# Patient Record
Sex: Female | Born: 1960 | Race: White | Hispanic: No | Marital: Single | Smoking: Never smoker
Health system: Southern US, Community
[De-identification: ages and names within clinical notes are randomized; demographics above are authoritative.]

## PROBLEM LIST (undated history)

## (undated) DIAGNOSIS — I10 Essential (primary) hypertension: Secondary | ICD-10-CM

## (undated) DIAGNOSIS — K50819 Crohn's disease of both small and large intestine with unspecified complications: Secondary | ICD-10-CM

## (undated) DIAGNOSIS — Z9889 Other specified postprocedural states: Secondary | ICD-10-CM

## (undated) DIAGNOSIS — J301 Allergic rhinitis due to pollen: Secondary | ICD-10-CM

## (undated) DIAGNOSIS — D219 Benign neoplasm of connective and other soft tissue, unspecified: Secondary | ICD-10-CM

## (undated) DIAGNOSIS — R112 Nausea with vomiting, unspecified: Secondary | ICD-10-CM

## (undated) DIAGNOSIS — T63301A Toxic effect of unspecified spider venom, accidental (unintentional), initial encounter: Secondary | ICD-10-CM

## (undated) DIAGNOSIS — K649 Unspecified hemorrhoids: Secondary | ICD-10-CM

## (undated) DIAGNOSIS — B1920 Unspecified viral hepatitis C without hepatic coma: Secondary | ICD-10-CM

## (undated) HISTORY — PX: TUBAL LIGATION: SHX77

## (undated) HISTORY — DX: Crohn's disease of both small and large intestine with unspecified complications: K50.819

---

## 2007-05-20 ENCOUNTER — Ambulatory Visit: Payer: Self-pay | Admitting: Gastroenterology

## 2007-07-05 ENCOUNTER — Other Ambulatory Visit: Admission: RE | Admit: 2007-07-05 | Discharge: 2007-07-05 | Payer: Self-pay | Admitting: Family Medicine

## 2007-07-20 ENCOUNTER — Encounter: Admission: RE | Admit: 2007-07-20 | Discharge: 2007-07-20 | Payer: Self-pay | Admitting: Family Medicine

## 2007-11-09 ENCOUNTER — Encounter: Admission: RE | Admit: 2007-11-09 | Discharge: 2007-11-09 | Payer: Self-pay | Admitting: Obstetrics and Gynecology

## 2008-08-04 ENCOUNTER — Encounter: Admission: RE | Admit: 2008-08-04 | Discharge: 2008-08-04 | Payer: Self-pay | Admitting: Family Medicine

## 2009-01-24 ENCOUNTER — Other Ambulatory Visit: Admission: RE | Admit: 2009-01-24 | Discharge: 2009-01-24 | Payer: Self-pay | Admitting: Family Medicine

## 2013-05-15 ENCOUNTER — Inpatient Hospital Stay (HOSPITAL_COMMUNITY)
Admission: EM | Admit: 2013-05-15 | Discharge: 2013-05-16 | DRG: 174 | Disposition: A | Discharge status: Home / Self Care | Payer: BC Managed Care – PPO | Attending: Internal Medicine | Admitting: Internal Medicine

## 2013-05-15 ENCOUNTER — Encounter (HOSPITAL_COMMUNITY): Payer: Self-pay | Admitting: Nurse Practitioner

## 2013-05-15 DIAGNOSIS — K922 Gastrointestinal hemorrhage, unspecified: Principal | ICD-10-CM | POA: Diagnosis present

## 2013-05-15 DIAGNOSIS — D649 Anemia, unspecified: Secondary | ICD-10-CM

## 2013-05-15 DIAGNOSIS — I951 Orthostatic hypotension: Secondary | ICD-10-CM | POA: Diagnosis present

## 2013-05-15 DIAGNOSIS — D62 Acute posthemorrhagic anemia: Secondary | ICD-10-CM | POA: Diagnosis present

## 2013-05-15 DIAGNOSIS — D892 Hypergammaglobulinemia, unspecified: Secondary | ICD-10-CM | POA: Diagnosis present

## 2013-05-15 DIAGNOSIS — B1920 Unspecified viral hepatitis C without hepatic coma: Secondary | ICD-10-CM | POA: Diagnosis present

## 2013-05-15 DIAGNOSIS — K649 Unspecified hemorrhoids: Secondary | ICD-10-CM

## 2013-05-15 DIAGNOSIS — D509 Iron deficiency anemia, unspecified: Secondary | ICD-10-CM | POA: Diagnosis present

## 2013-05-15 HISTORY — DX: Unspecified viral hepatitis C without hepatic coma: B19.20

## 2013-05-15 HISTORY — DX: Allergic rhinitis due to pollen: J30.1

## 2013-05-15 HISTORY — DX: Benign neoplasm of connective and other soft tissue, unspecified: D21.9

## 2013-05-15 HISTORY — DX: Essential (primary) hypertension: I10

## 2013-05-15 LAB — CBC
HCT: 20.7 % — ABNORMAL LOW (ref 36.0–46.0)
Hemoglobin: 6 g/dL — CL (ref 12.0–15.0)
MCHC: 29 g/dL — ABNORMAL LOW (ref 30.0–36.0)
MCV: 71.1 fL — ABNORMAL LOW (ref 78.0–100.0)
RDW: 17.9 % — ABNORMAL HIGH (ref 11.5–15.5)

## 2013-05-15 LAB — COMPREHENSIVE METABOLIC PANEL
AST: 53 U/L — ABNORMAL HIGH (ref 0–37)
BUN: 9 mg/dL (ref 6–23)
CO2: 20 mEq/L (ref 19–32)
GFR calc non Af Amer: >90 mL/min (ref >90)
Glucose, Bld: 109 mg/dL — ABNORMAL HIGH (ref 70–99)
Potassium: 3.8 mEq/L (ref 3.5–5.1)

## 2013-05-15 LAB — PREPARE RBC (CROSSMATCH)

## 2013-05-15 LAB — CBC WITH DIFFERENTIAL/PLATELET
Basophils Relative: 1 % (ref 0–1)
Eosinophils Relative: 4 % (ref 0–5)
Lymphocytes Relative: 46 % (ref 12–46)
Lymphs Abs: 3 10*3/uL (ref 0.7–4.0)
MCHC: 28.6 g/dL — ABNORMAL LOW (ref 30.0–36.0)
Monocytes Absolute: 0.7 10*3/uL (ref 0.1–1.0)
Neutro Abs: 2.4 10*3/uL (ref 1.7–7.7)
Neutrophils Relative %: 38 % — ABNORMAL LOW (ref 43–77)
Platelets: 604 10*3/uL — ABNORMAL HIGH (ref 150–400)
RDW: 18 % — ABNORMAL HIGH (ref 11.5–15.5)
WBC: 6.4 10*3/uL (ref 4.0–10.5)

## 2013-05-15 LAB — PROTIME-INR
INR: 0.98 (ref 0.00–1.49)
Prothrombin Time: 12.8 seconds (ref 11.6–15.2)

## 2013-05-15 MED ORDER — SODIUM CHLORIDE 0.9 % IV BOLUS (SEPSIS)
1000.0000 mL | Freq: Once | INTRAVENOUS | Status: AC
  Administered 2013-05-15: 1000 mL via INTRAVENOUS

## 2013-05-15 MED ORDER — SODIUM CHLORIDE 0.9 % IV SOLN
INTRAVENOUS | Status: DC
  Administered 2013-05-16: 06:00:00 via INTRAVENOUS

## 2013-05-15 MED ORDER — SODIUM CHLORIDE 0.9 % IJ SOLN: 3.0000 mL | Freq: Two times a day (BID) | INTRAMUSCULAR | Status: DC

## 2013-05-15 MED ORDER — PANTOPRAZOLE SODIUM 40 MG IV SOLR
40.0000 mg | Freq: Two times a day (BID) | INTRAVENOUS | Status: DC
  Administered 2013-05-16: 40 mg via INTRAVENOUS
  Filled 2013-05-15 (×3): qty 40

## 2013-05-15 NOTE — ED Notes (Signed)
Per admitting MD Winona Legato, pt order to be transfused after result of next timed CBC lab results are completed. Pt is asymptomatic, denies any complaints at this time and will continue to monitor pt.

## 2013-05-15 NOTE — ED Notes (Signed)
Pt states that she is just feeling very tired. Pt denies chest pain. Pt states about one week ago she started having bloody bowel movements. Pt states 2 years ago she had colonoscopy and was told she had internal hemorrhoids. Pt states she has not had bloody bowel movements every day. Pt states some foods trigger it and sometimes she has diarrhea. Pt states she used to be very active and now she is unable to exercise as much. Pt states hx of hep C and needs liver biopsy. Pt alert and mentating appropriately.

## 2013-05-15 NOTE — H&P (Signed)
Date: 05/15/2013               Patient Name:  Abigail Beck MRN: [id number]  DOB: [birth year] Age / Sex: 52 y.o., female   PCP: Romie Jumper, PA-C         Medical Service: Internal Medicine Teaching Service         Attending Physician: Dr. Audree Camel, MD    First Contact: Dr. Archer Asa, MD Pager: [phone]  Second Contact: Dr. Suszanne Conners, MD Pager: [phone]       After Hours (After 5p/  First Contact Pager: [phone]  weekends / holidays): Second Contact Pager: [phone]   Chief Complaint: Blood in stool  History of Present Illness: Abigail Beck is a 52 y.o. woman whith a pmhx sign for hep C (unknown status) who presents with a cc of blood in her stool.Two days before the admission, she went to GI MD because having chronic diarrhea that was now complicated by blood. The patient reports 7-8 months of diarrhea at 5 times per day. She has a crampy feeling that is releaved by BM. She has urgency to get tot he rest room but no incontinence.  The diarrhea initially improved when she cut out dairy and gluten from her diet. During this time she has noticed blood in her stool intermittently. She thought it was likely due to her hemorrhoid as there was just a small amount of blood on the tissue. However, over the last few weeks she noticed clots in her stool as well as bright red blood of small volume. The last time she saw blood in stool was today prior to arrival at ED. The episode before that was on Friday. She associates it with spicey/processed foods. She admits to feeling weak and tired for about 6 months. Apparently, blood work done by Daisy Floro in ?June was significant for anemia, but was thought to not be significant enough tow arrant further investigation at that time. She has been on iron for anemia before 2/2 fibroids, but has no h/o blood transfusions  Of note, Dr. Loman Chroman  thought that the BBM could be due to colitis and prescribed a medication that that patient  cannot recal. GI got blood work on the morning of the admission as pre-op for potential liver bx, which revealed a Hg 6.5. The patient was contacted and told to go to the ED.  Denies, SOB, N/V, CP, abdominal pain, constipation, fevers, chills, dysuria.  Meds: No current facility-administered medications for this encounter.   Current Outpatient Prescriptions  Medication Sig Dispense Refill  . Multiple Vitamins-Minerals (MULTIVITAMIN WITH MINERALS) tablet Take 1 tablet by mouth daily.        Allergies: Allergies as of 05/15/2013  . (No Known Allergies)   Past Medical History  Diagnosis Date  . Hepatitis C ~2011/2012    Incidental with physical; unclear etiology  . Fibroids   . Hypertension     Situational, diet & exercise controlled  . Hay fever    Past Surgical History  Procedure Laterality Date  . Tubal ligation     Family History  Problem Relation Age of Onset  . Alzheimer's disease Mother   . Diabetes Mother   . Glucose-6-phos deficiency Son   . Healthy Father     63 yo without medical problems  . Healthy Paternal Grandmother     32 yo without medical problems  . Liver disease Sister     ?Fatty liver disease   History  Social History  . Marital Status: Single    Spouse Name: N/A    Number of Children: N/A  . Years of Education: college   Occupational History  . pharmceutical representative    Social History Main Topics  . Smoking status: Never Smoker   . Smokeless tobacco: Not on file  . Alcohol Use: No  . Drug Use: No  . Sexual Activity: No     Comment: divorced around 2006, not sexually active since   Other Topics Concern  . Not on file   Social History Narrative   Lives alone, son is in [place] (Psychologist, counselling at [employer]    Review of Systems: Pertinent items are noted in HPI.  Physical Exam: Blood pressure 137/80, pulse 95, temperature 98.9 F (37.2 C), temperature source Oral, resp. rate 15, height 5\' 5"  (1.651 m), weight 150 lb  (68.04 kg), SpO2 100.00%. Physical Exam  Constitutional: She is oriented to person, place, and time. She appears well-developed and well-nourished. No distress.  HENT:  Mouth/Throat: Oropharynx is clear and moist. No oropharyngeal exudate.  Eyes: EOM are normal. Pupils are equal, round, and reactive to light.  Pale conjunctiva  Cardiovascular: Regular rhythm and intact distal pulses.  Exam reveals no gallop and no friction rub.   Murmur heard. Systolic flow murmur, tachycardic. (increased 100 to 120 when sat up)  Pulmonary/Chest: Effort normal and breath sounds normal. No respiratory distress. She has no wheezes. She has no rales. She exhibits no tenderness.  Abdominal: Soft. Bowel sounds are normal. She exhibits no distension. There is no tenderness. There is no rebound and no guarding.  Genitourinary:  hemorrhoid present, not active bleeding.  Musculoskeletal: She exhibits no edema and no tenderness.  Neurological: She is alert and oriented to person, place, and time.  Skin: She is not diaphoretic. No erythema. There is pallor.  Psychiatric: She has a normal mood and affect. Her behavior is normal.  Orthostatics: Lying 142/85, 100 HR Standing: 121/75 HR 108  Lab results: Basic Metabolic Panel:  Recent Labs  09/81/19 1735  NA 133*  K 3.8  CL 102  CO2 20  GLUCOSE 109*  BUN 9  CREATININE 0.73  CALCIUM 8.7  Gap: 11  Liver Function Tests:  Recent Labs  05/15/13 1735  AST 53*  ALT 24  ALKPHOS PENDING  BILITOT 0.2*  PROT 8.9*  ALBUMIN 2.7*   CBC:  Recent Labs  05/15/13 1735  WBC 6.4  NEUTROABS 2.4  HGB 6.9*  HCT 24.1*  MCV 71.1*  PLT 604*   Coagulation:  Recent Labs  05/15/13 1854  LABPROT 12.8  INR 0.98    Assessment & Plan by Problem: Principal Problem:   Acute blood loss anemia Active Problems:   Microcytic anemia  # GI Bleed c/b Microcytic Anemia, tachycardia and orthostasis GI bleed is causing hemodynamic changes necessitating admission. The  bleed could be upper or lower, but more likely lower given BRBPR and BUN/Cr ratio of <35. FOBT +. Etiologies are hemorrhoid, bleeding diverticula, malignancy, and inflammatory bowel disease. - Repeat CBC at 9 pm and transfuse with Hg goal of 7.0. - Consult GI in AM (Dr. Loman Chroman). - 40 IV BID PPI - F/U EKG and anemia panel - Need records from Dr. 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 Cirrhosis and hepatitis can also cause a protein gap. - F/U Repeat CMP in AM for confirmation - F/U as outpatient, unless  anemia is refractory to treatment of GI condition  # Hepatitis C - Appears stable, encourage follow up as outpatient. - Need records from Dr. Loman Chroman  # DVT: SCDs  # Nutrition: clears until midnight then NPO  Dispo: Disposition is deferred at this time, awaiting improvement of current medical problems. Anticipated discharge in approximately 1-2 day(s).   The patient does have a current PCP (Lucy Bernadette Hoit, PA-C) and does not need an [facility] hospital follow-up appointment after discharge.  The patient does not have transportation limitations that hinder transportation to clinic appointments.  Signed: Pleas Koch, MD 05/15/2013, 8:22 PM

## 2013-05-15 NOTE — ED Notes (Signed)
Cri ?

## 2013-05-15 NOTE — ED Notes (Signed)
Pt ambulatory to restroom with steady gait. Pt denies any dizziness or complaints upon return. Will continue to monitor pt.

## 2013-05-15 NOTE — ED Notes (Signed)
Admitting MD at bedside, pt awaiting inpt beds assignment.  

## 2013-05-15 NOTE — ED Provider Notes (Signed)
CSN: [id number]     Arrival date & time 05/15/13  1708 History   First MD Initiated Contact with Patient 05/15/13 1755     Chief Complaint  Patient presents with  . Anemia   (Consider location/radiation/quality/duration/timing/severity/associated sxs/prior Treatment) HPI Comments: [age]-year-old female presents to the emergency department for anemia. She states she had blood work done today in preparation for a liver biopsy for hepatitis C. They called her when they found that her hemoglobin was 6.5. She states that she has been fatigued for several weeks to months has had shortness of breath with less exertion than normal. She denies any chest pain. She has a vomiting or nausea. Denies abdominal pain. She has had intermittent blood in her stools the past week. Does not occur every time. Should have pain when she is having bowel movements. She did say she had a colonoscopy one year ago by Dr. Loman Chroman and states that it showed ulcerative colitis. He states that per her gastroenterologist she had an ultrasound that showed no damage to her liver.  The history is provided by the patient.    Past Medical History  Diagnosis Date  . Hepatitis C   . Fibroids    Past Surgical History  Procedure Laterality Date  . Tubal ligation     History reviewed. No pertinent family history. History  Substance Use Topics  . Smoking status: Never Smoker   . Smokeless tobacco: Not on file  . Alcohol Use: No   OB History   Grav Para Term Preterm Abortions TAB SAB Ect Mult Living                 Review of Systems  Constitutional: Positive for fatigue. Negative for fever and chills.  Respiratory: Positive for shortness of breath (with exertion). Negative for cough.   Cardiovascular: Negative for chest pain.  Gastrointestinal: Positive for blood in stool. Negative for nausea, vomiting, abdominal pain, diarrhea, constipation and rectal pain.  All other systems reviewed and are  negative.    Allergies  Review of patient's allergies indicates no known allergies.  Home Medications   Current Outpatient Rx  Name  Route  Sig  Dispense  Refill  . Multiple Vitamins-Minerals (MULTIVITAMIN WITH MINERALS) tablet   Oral   Take 1 tablet by mouth daily.          BP 137/77  Pulse 101  Temp(Src) 98.9 F (37.2 C) (Oral)  Resp 15  Ht 5\' 5"  (1.651 m)  Wt 150 lb (68.04 kg)  BMI 24.96 kg/m2  SpO2 97% Physical Exam  Vitals reviewed. Constitutional: She is oriented to person, place, and time. She appears well-developed and well-nourished. No distress.  HENT:  Head: Normocephalic and atraumatic.  Right Ear: External ear normal.  Left Ear: External ear normal.  Nose: Nose normal.  Eyes: Right eye exhibits no discharge. Left eye exhibits no discharge.  Cardiovascular: Regular rhythm and normal heart sounds.   Mild tachycardia  Pulmonary/Chest: Effort normal and breath sounds normal.  Abdominal: Soft. There is no tenderness.  Genitourinary: Rectal exam shows external hemorrhoid (Nonthrombosed). Rectal exam shows no mass and no tenderness.  Neurological: She is alert and oriented to person, place, and time.  Skin: Skin is warm and dry.    ED Course  Procedures (including critical care time) Labs Review Labs Reviewed  CBC WITH DIFFERENTIAL - Abnormal; Notable for the following:    RBC 3.39 (*)    Hemoglobin 6.9 (*)    HCT 24.1 (*)  MCV 71.1 (*)    MCH 20.4 (*)    MCHC 28.6 (*)    RDW 18.0 (*)    Platelets 604 (*)    Neutrophils Relative % 38 (*)    All other components within normal limits  COMPREHENSIVE METABOLIC PANEL  PROTIME-INR  APTT  TYPE AND SCREEN  PREPARE RBC (CROSSMATCH)   Imaging Review No results found.  MDM   1. Anemia    Mild tachycardia here but appears well. Per patient she has hx of UC, no gross blood on rectal. Given her hx of bleeding per rectum with her HR and Hgb she will need transfusion and admission. No upper GI sx, and  per patient she has normal liver function. Will admit to medicine.    Audree Camel, MD 05/16/13 [id number]

## 2013-05-15 NOTE — ED Notes (Signed)
Pt had blood work done at PCP office today for pre op and her Hgb was low. Pt is supposed to have a liver biopsy because she has hepatitis C. Pt states it was 6.5. Reports a history of anemia. She has felt dizzy and SOB intermittently lately but these symptoms have been mild. A&Ox4, resp e/u

## 2013-05-16 DIAGNOSIS — D649 Anemia, unspecified: Secondary | ICD-10-CM

## 2013-05-16 LAB — CBC
HCT: 23.6 % — ABNORMAL LOW (ref 36.0–46.0)
MCHC: 30.1 g/dL (ref 30.0–36.0)
MCV: 72.8 fL — ABNORMAL LOW (ref 78.0–100.0)
RDW: 18.1 % — ABNORMAL HIGH (ref 11.5–15.5)

## 2013-05-16 LAB — COMPREHENSIVE METABOLIC PANEL
Albumin: 2.3 g/dL — ABNORMAL LOW (ref 3.5–5.2)
BUN: 6 mg/dL (ref 6–23)
Creatinine, Ser: 0.73 mg/dL (ref 0.50–1.10)
Total Protein: 7.6 g/dL (ref 6.0–8.3)

## 2013-05-16 LAB — ABO/RH: ABO/RH(D): B POS

## 2013-05-16 LAB — OCCULT BLOOD, POC DEVICE: Fecal Occult Bld: POSITIVE — AB

## 2013-05-16 NOTE — Progress Notes (Signed)
Pt states MD asked for name of medication that was prescribed to her this past Friday.  Pt states name is APRISO. Dierdre Highman, RN

## 2013-05-16 NOTE — Progress Notes (Signed)
Utilization review completed.  

## 2013-05-16 NOTE — Progress Notes (Signed)
Pt received 1 unit PRBC.  Transfusion complete. Pt tolerated well. Dierdre Highman, RN

## 2013-05-16 NOTE — Discharge Summary (Signed)
Name: Abigail Beck MRN: [id number] DOB: [birth year] 52 y.o. PCP: Abigail Jumper, PA-C  Date of Admission: 05/15/2013  5:49 PM Date of Discharge: 05/16/2013 Attending Physician: Rocco Serene, MD  Discharge Diagnosis: Principal Problem:   Acute blood loss anemia Active Problems:   Microcytic anemia  Discharge Medications:   Medication List         multivitamin with minerals tablet  Take 1 tablet by mouth daily.        Disposition and follow-up:   Abigail Beck was discharged from [facility] in Good condition.  At the hospital follow up visit please address:  1.  Any further episodes of bloody bowel movements  2.  Labs / imaging needed at time of follow-up: CBC  3.  Pending labs/ test needing follow-up: None  Follow-up Appointments:     Follow-up Information   Follow up with Abigail Mo, MD On 05/20/2013. (10:30am)    Specialty:  Gastroenterology   Contact information:   [address] Abig[place] [address] [phone]       Discharge Instructions: Discharge Orders   Future Orders Complete By Expires   Call MD for:  extreme fatigue  As directed    Call MD for:  persistant dizziness or light-headedness  As directed    Diet - low sodium heart healthy  As directed    Increase activity slowly  As directed       Consultations:  None  Procedures Performed:  No results found.  2D Echo: None  Cardiac Cath: None  Admission HPI: Abigail Beck is a 53 y.o. woman whith a pmhx sign for hep C (unknown status) who presents with a cc of blood in her stool.Two days before the admission, she went to GI MD because having chronic diarrhea that was now complicated by blood. The patient reports 7-8 months of diarrhea at 5 times per day. She has a crampy feeling that is releaved by BM. She has urgency to get tot he rest room but no incontinence. The diarrhea initially improved when she cut out dairy and gluten  from her diet. During this time she has noticed blood in her stool intermittently. She thought it was likely due to her hemorrhoid as there was just a small amount of blood on the tissue. However, over the last few weeks she noticed clots in her stool as well as bright red blood of small volume. The last time she saw blood in stool was today prior to arrival at ED. The episode before that was on Friday. She associates it with spicey/processed foods. She admits to feeling weak and tired for about 6 months. Apparently, blood work done by Abigail Beck in ?June was significant for anemia, but was thought to not be significant enough tow arrant further investigation at that time. She has been on iron for anemia before 2/2 fibroids, but has no h/o blood transfusions  Of note, Dr. Loman Beck thought that the BBM could be due to colitis and prescribed a medication that that patient cannot recal. GI got blood work on the morning of the admission as pre-op for potential liver bx, which revealed a Hg 6.5. The patient was contacted and told to go to the ED.  Denies, SOB, N/V, CP, abdominal pain, constipation, fevers, chills, dysuria.   Hospital Course by problem list: Principal Problem:   Acute blood loss anemia-Pt is a 59 yof who comes into the ED after blood work for a liver biopsy (  pt is HepC +) revealed a hemoglobin of 6.5 and was told to come to the ED by her gastroenterologist. Upon arrival, pt hemoglobin was found to be 6.9. She expressed fatigue for several weeks to months and experienced shortness of breath with less exertion than normal. She denied any chest pain, nausea, vomiting, or abdominal pain. She reports intermittent blood in her stools the past week. She states she had a colonoscopy one year ago by Dr. Loman Beck and  that it showed ulcerative colitis. She states that per her gastroenterologist she had an ultrasound that showed no damage to her liver.  This lower GI bleed caused hemodynamic changes  necessitating admission. The bleed could be upper or lower, but more likely lower given BRBPR and BUN/Cr ratio of <35. FOBT +. Etiologies are hemorrhoid, bleeding diverticula, malignancy, and inflammatory bowel disease.  During her hospital course, she was transfused 1 unit of PRBCs with a goal to transfuse of less than 7.0. A repeat CBC the next morning revealed her hemoglobin came up to 7.1.  She was given 40mg  IV bid protonix and the next morning denied any additional episodes of hematochezia. I spoke with Dr. Marcello Fennel PA and we discussed options and it was decided that pt would receive close follow-up from Dr. Loman Beck and she wold be discharged. She was hemodynamically stable and was given an appointment on Sept. 19 with Dr. Loman Beck.   Active Problems:   Microcytic anemia-Stable. Most likely related to GI bleed.   Discharge Vitals:   BP 126/88  Pulse 83  Temp(Src) 98 F (36.7 C) (Oral)  Resp 18  Ht 5\' 5"  (1.651 m)  Wt 69.174 kg (152 lb 8 oz)  BMI 25.38 kg/m2  SpO2 100%  Discharge Labs:  Results for orders placed during the hospital encounter of 05/15/13 (from the past 24 hour(s))  CBC WITH DIFFERENTIAL     Status: Abnormal   Collection Time    05/15/13  5:35 PM      Result Value Range   WBC 6.4  4.0 - 10.5 K/uL   RBC 3.39 (*) 3.87 - 5.11 MIL/uL   Hemoglobin 6.9 (*) 12.0 - 15.0 g/dL   HCT 16.1 (*) 09.6 - 04.5 %   MCV 71.1 (*) 78.0 - 100.0 fL   MCH 20.4 (*) 26.0 - 34.0 pg   MCHC 28.6 (*) 30.0 - 36.0 g/dL   RDW 40.9 (*) 81.1 - 91.4 %   Platelets 604 (*) 150 - 400 K/uL   Neutrophils Relative % 38 (*) 43 - 77 %   Neutro Abs 2.4  1.7 - 7.7 K/uL   Lymphocytes Relative 46  12 - 46 %   Lymphs Abs 3.0  0.7 - 4.0 K/uL   Monocytes Relative 11  3 - 12 %   Monocytes Absolute 0.7  0.1 - 1.0 K/uL   Eosinophils Relative 4  0 - 5 %   Eosinophils Absolute 0.3  0.0 - 0.7 K/uL   Basophils Relative 1  0 - 1 %   Basophils Absolute 0.1  0.0 - 0.1 K/uL  COMPREHENSIVE METABOLIC PANEL     Status:  Abnormal   Collection Time    05/15/13  5:35 PM      Result Value Range   Sodium 133 (*) 135 - 145 mEq/L   Potassium 3.8  3.5 - 5.1 mEq/L   Chloride 102  96 - 112 mEq/L   CO2 20  19 - 32 mEq/L   Glucose, Bld 109 (*) 70 - 99  mg/dL   BUN 9  6 - 23 mg/dL   Creatinine, Ser 1.61  0.50 - 1.10 mg/dL   Calcium 8.7  8.4 - 09.6 mg/dL   Total Protein 8.9 (*) 6.0 - 8.3 g/dL   Albumin 2.7 (*) 3.5 - 5.2 g/dL   AST 53 (*) 0 - 37 U/L   ALT 24  0 - 35 U/L   Alkaline Phosphatase 221 (*) 39 - 117 U/L   Total Bilirubin 0.2 (*) 0.3 - 1.2 mg/dL   GFR calc non Af Amer >90  >90 mL/min   GFR calc Af Amer >90  >90 mL/min  OCCULT BLOOD, POC DEVICE     Status: Abnormal   Collection Time    05/15/13  6:33 PM      Result Value Range   Fecal Occult Bld POSITIVE (*) NEGATIVE  TYPE AND SCREEN     Status: None   Collection Time    05/15/13  6:40 PM      Result Value Range   ABO/RH(D) B POS     Antibody Screen NEG     Sample Expiration 05/18/2013     Unit Number [id number]     Blood Component Type RBC LR PHER2     Unit division 00     Status of Unit ISSUED,FINAL     Transfusion Status OK TO TRANSFUSE     Crossmatch Result Compatible     Unit Number [id number]     Blood Component Type RED CELLS,LR     Unit division 00     Status of Unit ALLOCATED     Transfusion Status OK TO TRANSFUSE     Crossmatch Result Compatible    PREPARE RBC (CROSSMATCH)     Status: None   Collection Time    05/15/13  6:40 PM      Result Value Range   Order Confirmation ORDER PROCESSED BY BLOOD BANK    ABO/RH     Status: None   Collection Time    05/15/13  6:40 PM      Result Value Range   ABO/RH(D) B POS    PROTIME-INR     Status: None   Collection Time    05/15/13  6:54 PM      Result Value Range   Prothrombin Time 12.8  11.6 - 15.2 seconds   INR 0.98  0.00 - 1.49  APTT     Status: None   Collection Time    05/15/13  6:54 PM      Result Value Range   aPTT 31  24 - 37 seconds  CBC     Status: Abnormal    Collection Time    05/15/13  9:44 PM      Result Value Range   WBC 6.5  4.0 - 10.5 K/uL   RBC 2.91 (*) 3.87 - 5.11 MIL/uL   Hemoglobin 6.0 (*) 12.0 - 15.0 g/dL   HCT 84.6 (*) 96.2 - 95.2 %   MCV 71.1 (*) 78.0 - 100.0 fL   MCH 20.6 (*) 26.0 - 34.0 pg   MCHC 29.0 (*) 30.0 - 36.0 g/dL   RDW 84.1 (*) 32.4 - 40.1 %   Platelets 464 (*) 150 - 400 K/uL  CBC     Status: Abnormal   Collection Time    05/16/13  4:35 AM      Result Value Range   WBC 6.5  4.0 - 10.5 K/uL   RBC 3.24 (*) 3.87 - 5.11 MIL/uL  Hemoglobin 7.1 (*) 12.0 - 15.0 g/dL   HCT 40.9 (*) 81.1 - 91.4 %   MCV 72.8 (*) 78.0 - 100.0 fL   MCH 21.9 (*) 26.0 - 34.0 pg   MCHC 30.1  30.0 - 36.0 g/dL   RDW 78.2 (*) 95.6 - 21.3 %   Platelets 453 (*) 150 - 400 K/uL  COMPREHENSIVE METABOLIC PANEL     Status: Abnormal   Collection Time    05/16/13  4:35 AM      Result Value Range   Sodium 139  135 - 145 mEq/L   Potassium 3.7  3.5 - 5.1 mEq/L   Chloride 108  96 - 112 mEq/L   CO2 21  19 - 32 mEq/L   Glucose, Bld 84  70 - 99 mg/dL   BUN 6  6 - 23 mg/dL   Creatinine, Ser 0.86  0.50 - 1.10 mg/dL   Calcium 8.4  8.4 - 57.8 mg/dL   Total Protein 7.6  6.0 - 8.3 g/dL   Albumin 2.3 (*) 3.5 - 5.2 g/dL   AST 36  0 - 37 U/L   ALT 18  0 - 35 U/L   Alkaline Phosphatase 181 (*) 39 - 117 U/L   Total Bilirubin 0.4  0.3 - 1.2 mg/dL   GFR calc non Af Amer >90  >90 mL/min   GFR calc Af Amer >90  >90 mL/min    Signed: Boykin Peek, MD 05/16/2013, 2:06 PM   Time Spent on Discharge: 50 minutes Services Ordered on Discharge: None Equipment Ordered on Discharge: None

## 2013-05-16 NOTE — Progress Notes (Signed)
DC orders received.  Patient stable with no S/S of distress.  Medication and discharge information reviewed with patient.  Patient DC home. Kama Cammarano Marie  

## 2013-05-16 NOTE — H&P (Signed)
I saw and evaluated the patient. I personally confirmed the key portions of Dr. Louann Liv history and exam and reviewed pertinent patient test results. The assessment, diagnosis, and plan were reviewed with the housestaff and I agree with the documentation in the resident's note.  We were able to contact her gastroenterologist this AM and get more detailed GI history as well as clarify her other history with the patient.  She apparently has had similar symptoms before and underwent a colonoscopy which only revealed internal hemorrhoids (this is what was told to the patient).  She also has a history of iron deficiency anemia from pervious heavy menstrual periods, which required iron supplementation.  She has not had a period in over 2 years but has also not been taking her iron and has unclear iron stores.  She was transfused last night for symptomatic orthostatic hypotension and a Hgb < 7.0.  She feels subjectively much improved this AM with no more orthostasis.  She is stable for discharge with follow-up as scheduled with her gastroenterologist.

## 2013-05-18 LAB — TYPE AND SCREEN: Unit division: 0

## 2015-01-01 DIAGNOSIS — T63301A Toxic effect of unspecified spider venom, accidental (unintentional), initial encounter: Secondary | ICD-10-CM

## 2015-01-01 HISTORY — DX: Toxic effect of unspecified spider venom, accidental (unintentional), initial encounter: T63.301A

## 2015-01-16 ENCOUNTER — Telehealth: Payer: Self-pay | Admitting: Hematology & Oncology

## 2015-01-16 NOTE — Telephone Encounter (Signed)
I spoke w NEW PATIENT today to remind them of their appointment with Dr. Ennever. Also, advised them to bring all medication bottles and insurance card information. ° °

## 2015-01-22 ENCOUNTER — Other Ambulatory Visit: Payer: Self-pay | Admitting: Family

## 2015-01-22 DIAGNOSIS — D509 Iron deficiency anemia, unspecified: Secondary | ICD-10-CM

## 2015-01-22 DIAGNOSIS — D473 Essential (hemorrhagic) thrombocythemia: Secondary | ICD-10-CM

## 2015-01-22 DIAGNOSIS — D75839 Thrombocytosis, unspecified: Secondary | ICD-10-CM

## 2015-01-23 ENCOUNTER — Other Ambulatory Visit: Payer: Self-pay | Admitting: Family

## 2015-01-23 ENCOUNTER — Ambulatory Visit: Payer: BLUE CROSS/BLUE SHIELD

## 2015-01-23 ENCOUNTER — Encounter: Payer: Self-pay | Admitting: Family

## 2015-01-23 ENCOUNTER — Ambulatory Visit (HOSPITAL_BASED_OUTPATIENT_CLINIC_OR_DEPARTMENT_OTHER): Payer: BLUE CROSS/BLUE SHIELD | Admitting: Family

## 2015-01-23 ENCOUNTER — Other Ambulatory Visit (HOSPITAL_BASED_OUTPATIENT_CLINIC_OR_DEPARTMENT_OTHER): Payer: BLUE CROSS/BLUE SHIELD

## 2015-01-23 VITALS — BP 117/72 | HR 103 | Temp 98.3°F | Resp 16 | Ht 65.0 in | Wt 112.0 lb

## 2015-01-23 DIAGNOSIS — D509 Iron deficiency anemia, unspecified: Secondary | ICD-10-CM | POA: Diagnosis not present

## 2015-01-23 DIAGNOSIS — D75839 Thrombocytosis, unspecified: Secondary | ICD-10-CM

## 2015-01-23 DIAGNOSIS — D473 Essential (hemorrhagic) thrombocythemia: Secondary | ICD-10-CM

## 2015-01-23 LAB — CBC WITH DIFFERENTIAL (CANCER CENTER ONLY)
BASO#: 0 10*3/uL (ref 0.0–0.2)
BASO%: 0.4 % (ref 0.0–2.0)
EOS ABS: 0 10*3/uL (ref 0.0–0.5)
EOS%: 0.4 % (ref 0.0–7.0)
HCT: 27.8 % — ABNORMAL LOW (ref 34.8–46.6)
HEMOGLOBIN: 8.6 g/dL — AB (ref 11.6–15.9)
LYMPH#: 2 10*3/uL (ref 0.9–3.3)
LYMPH%: 22.8 % (ref 14.0–48.0)
MCH: 24 pg — ABNORMAL LOW (ref 26.0–34.0)
MCHC: 30.9 g/dL — ABNORMAL LOW (ref 32.0–36.0)
MCV: 78 fL — AB (ref 81–101)
MONO#: 0.9 10*3/uL (ref 0.1–0.9)
MONO%: 10 % (ref 0.0–13.0)
NEUT#: 5.9 10*3/uL (ref 1.5–6.5)
NEUT%: 66.4 % (ref 39.6–80.0)
Platelets: 867 10*3/uL — ABNORMAL HIGH (ref 145–400)
RBC: 3.58 10*6/uL — AB (ref 3.70–5.32)
RDW: 17.7 % — ABNORMAL HIGH (ref 11.1–15.7)
WBC: 8.9 10*3/uL (ref 3.9–10.0)

## 2015-01-23 LAB — IRON AND TIBC CHCC
%SAT: 5 % — ABNORMAL LOW (ref 21–57)
Iron: 14 ug/dL — ABNORMAL LOW (ref 41–142)
TIBC: 274 ug/dL (ref 236–444)
UIBC: 260 ug/dL (ref 120–384)

## 2015-01-23 LAB — CHCC SATELLITE - SMEAR

## 2015-01-23 LAB — FERRITIN CHCC: Ferritin: 20 ng/ml (ref 9–269)

## 2015-01-23 NOTE — Progress Notes (Signed)
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 Chason is a very pleasant 54 yo African American female with a history of iron deficiency anemia. She now has an elevated platelet count at 867.  She has hemorrhoids that bleed consistently when she has a bowel movement. She states that she had her colonoscopy and had several benign polyps removed in 2014. She had repeatedly asked to have her hemorrhoids removed and this did not happen so she now plans to call some one else to see if this can be taken care of. Her Hgb today is 8.6, MCV 78. She has been taking oral iron supplements.  She is feeling very fatigued.  No sickle cell trait personally but does have strong family history of sickle cell and sickle cell trait.  She denies fever, chills, n/v, cough, rash, dizziness, headaches, blurred vision, SOB, chest pain, palpitations, abdominal pain, constipation, blood in urine.  She has problems with diarrhea but this is under fair control.  She denies swelling, tenderness, numbness or tingling in her extremities. No new aches or pains.  She has a spider bite on her left lower extremity and is taking doxycycline.  She has a good appetite and stays hydrated. Her son went into the army several years ago. When he was training she exercised with him and was able to lose around 40 lbs. She is still exercising and eating healthy.  She has 2 healthy children, 1 miscarriage and 2 abortions. She is going through menopause at this time and no longer has her cycles. Her son does have G6PD deficiency.  No cancer history.  Only surgery was tubal ligation.  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   ROS: All other 10 point review of systems is negative.   PAST MEDICAL HISTORY:   Past Medical History  Diagnosis Date  . Hepatitis C ~2011/2012    Incidental with physical; unclear etiology  . Fibroids   . Hypertension     Situational, diet & exercise controlled  . Hay fever     ALLERGIES: Allergies  Allergen Reactions  . Lactose Diarrhea and Nausea Only  . Lisinopril Cough      MEDICATIONS:  Current Outpatient Prescriptions on File Prior to Visit  Medication Sig Dispense Refill  . Multiple Vitamins-Minerals (MULTIVITAMIN WITH MINERALS) tablet Take 1 tablet by mouth daily.     No current facility-administered medications on file prior to visit.     PAST SURGICAL HISTORY Past Surgical History  Procedure Laterality Date  . Tubal ligation      FAMILY HISTORY: Family History  Problem Relation Age of Onset  . Alzheimer's disease Mother   . Diabetes Mother   . Glucose-6-phos deficiency Son   . Healthy Father     76 yo without medical problems  . Healthy Paternal Grandmother     27 yo without medical problems  . Liver disease Sister     ?Fatty liver disease    SOCIAL HISTORY:  reports that she has never smoked. She does not have any smokeless tobacco history on file. She reports that she does not drink alcohol or use illicit drugs.  PERFORMANCE STATUS: The patient's performance status  is 1 - Symptomatic but completely ambulatory  PHYSICAL EXAM: Most Recent Vital Signs: Blood pressure 117/72, pulse 103, temperature 98.3 F (36.8 C), temperature source Oral, resp. rate 16, height 5\' 5"  (1.651 m), weight 112 lb (50.803 kg). BP 117/72 mmHg  Pulse 103  Temp(Src) 98.3 F (36.8 C) (Oral)  Resp 16  Ht 5\' 5"  (1.651 m)  Wt 112 lb (50.803 kg)  BMI 18.64 kg/m2  General Appearance:    Alert, cooperative, no distress, appears stated age  Head:    Normocephalic, without obvious abnormality, atraumatic  Eyes:    PERRL,  conjunctiva/corneas clear, EOM's intact, fundi    benign, both eyes        Throat:   Lips, mucosa, and tongue normal; teeth and gums normal  Neck:   Supple, symmetrical, trachea midline, no adenopathy;    thyroid:  no enlargement/tenderness/nodules; no carotid   bruit or JVD  Back:     Symmetric, no curvature, ROM normal, no CVA tenderness  Lungs:     Clear to auscultation bilaterally, respirations unlabored  Chest Wall:    No tenderness or deformity   Heart:    Regular rate and rhythm, S1 and S2 normal, no murmur, rub   or gallop     Abdomen:     Soft, non-tender, bowel sounds active all four quadrants,    no masses, no organomegaly        Extremities:   Extremities normal, atraumatic, no cyanosis or edema  Pulses:   2+ and symmetric all extremities  Skin:   Skin color, texture, turgor normal, no rashes or lesions  Lymph nodes:   Cervical, supraclavicular, and axillary nodes normal  Neurologic:   CNII-XII intact, normal strength, sensation and reflexes    throughout   LABORATORY DATA:  Results for orders placed or performed in visit on 01/23/15 (from the past 48 hour(s))  CBC with Differential [facility])     Status: Abnormal   Collection Time: 01/23/15  8:46 AM  Result Value Ref Range   WBC 8.9 3.9 - 10.0 10e3/uL   RBC 3.58 (L) 3.70 - 5.32 10e6/uL   HGB 8.6 (L) 11.6 - 15.9 g/dL   HCT 27.8 (L) 34.8 - 46.6 %   MCV 78 (L) 81 - 101 fL   MCH 24.0 (L) 26.0 - 34.0 pg   MCHC 30.9 (L) 32.0 - 36.0 g/dL   RDW 17.7 (H) 11.1 - 15.7 %   Platelets 867 (H) 145 - 400 10e3/uL   NEUT# 5.9 1.5 - 6.5 10e3/uL   LYMPH# 2.0 0.9 - 3.3 10e3/uL   MONO# 0.9 0.1 - 0.9 10e3/uL   Eosinophils Absolute 0.0 0.0 - 0.5 10e3/uL   BASO# 0.0 0.0 - 0.2 10e3/uL   NEUT% 66.4 39.6 - 80.0 %   LYMPH% 22.8 14.0 - 48.0 %   MONO% 10.0 0.0 - 13.0 %   EOS% 0.4 0.0 - 7.0 %   BASO% 0.4 0.0 - 2.0 %  [facility] - Smear     Status: None   Collection Time: 01/23/15  8:46 AM  Result Value Ref Range   Smear  Result Smear Available       RADIOGRAPHY: No results found.     PATHOLOGY: None  ASSESSMENT/PLAN: Ms. Ehresman is a very pleasant 54 yo African American female with a history of iron deficiency anemia. She now has an elevated platelet count at 867 secondary to her anemia.  She has hemorrhoids that bleed consistently when she has a bowel  movement. She is following up with a new GI doctor to discuss having these banded.  Her Hgb is 8.6 and MCV 78. We will see what her iron studies show and bring her in for [facility] on a day convenient for her.  Dr. Marin Olp did review her blood smear which reveled some target cells and also microcytic hypochromic red cells.  She may need folic acid. We also checked an Alpha-thalassemia genotype on her today.  We will schedule her follow-up for 6 weeks after she get Feraheme.  All questions were answered. She knows to call the clinic with any problems, questions or concerns. We can certainly see her much sooner if necessary.  She was discussed with and also seen by Dr. Marin Olp and he is in agreement with the aforementioned.   [facility]    Addendum:  I saw and examined patient with Parrish Bonn. I will set her blood smear. I think she has iron deficiency. She has microcytic red blood cells. She does have some target cells. I suspect that she may also have some thalassemia. I do not see any immature myeloid lymphoid forms. She had no red cells. Her platelets were elevated in number. She had mostly small platelets.  Her iron studies include show iron deficiency. We will go ahead and give her IV iron.  I think she needs 2 doses of IV iron.  We will then plan to see her back in about a month or so. I suspect that her account will be down at that point. Her hemoglobin should be elevated and her MCV also should be better.  We will send off a thalassemia assay to see if she has alpha thalassemia.  We spent about 45 minutes with her today.  Lum Keas

## 2015-01-25 LAB — HEMOGLOBINOPATHY EVALUATION
Hemoglobin Other: 0 %
Hgb A2 Quant: 3.3 % — ABNORMAL HIGH (ref 2.2–3.2)
Hgb A: 65 % — ABNORMAL LOW (ref 96.8–97.8)
Hgb F Quant: 0.5 % (ref 0.0–2.0)
Hgb S Quant: 31.2 % — ABNORMAL HIGH

## 2015-01-25 LAB — RETICULOCYTES (CHCC)
ABS Retic: 98 10*3/uL (ref 19.0–186.0)
RBC.: 3.77 MIL/uL — ABNORMAL LOW (ref 3.87–5.11)
RETIC CT PCT: 2.6 % — AB (ref 0.4–2.3)

## 2015-01-26 ENCOUNTER — Ambulatory Visit: Payer: BLUE CROSS/BLUE SHIELD

## 2015-01-26 ENCOUNTER — Other Ambulatory Visit: Payer: Self-pay | Admitting: Family

## 2015-01-26 ENCOUNTER — Ambulatory Visit (HOSPITAL_BASED_OUTPATIENT_CLINIC_OR_DEPARTMENT_OTHER): Payer: BLUE CROSS/BLUE SHIELD

## 2015-01-26 VITALS — BP 107/72 | HR 94 | Temp 98.8°F | Resp 16 | Ht 65.0 in | Wt 117.0 lb

## 2015-01-26 DIAGNOSIS — D509 Iron deficiency anemia, unspecified: Secondary | ICD-10-CM | POA: Diagnosis not present

## 2015-01-26 MED ORDER — FOLIC ACID 1 MG PO TABS: 1.0000 mg | ORAL_TABLET | Freq: Every day | ORAL | Status: DC

## 2015-01-26 MED ORDER — SODIUM CHLORIDE 0.9 % IV SOLN
INTRAVENOUS | Status: DC
  Administered 2015-01-26: 12:00:00 via INTRAVENOUS

## 2015-01-26 MED ORDER — SODIUM CHLORIDE 0.9 % IV SOLN
510.0000 mg | Freq: Once | INTRAVENOUS | Status: AC
  Administered 2015-01-26: 510 mg via INTRAVENOUS
  Filled 2015-01-26: qty 17

## 2015-01-26 NOTE — Progress Notes (Signed)
Pt does have the sickle cell trait. I spoke with her over the phone about this and also let her know that she needs to start taking folic acid daily. Prescription for folic acid sent to pharmacy and patient coming in later today for a dose of iron.

## 2015-01-26 NOTE — Patient Instructions (Signed)

## 2015-01-31 LAB — ALPHA-THALASSEMIA GENOTYPR

## 2015-02-01 ENCOUNTER — Ambulatory Visit: Payer: BLUE CROSS/BLUE SHIELD

## 2015-02-05 ENCOUNTER — Other Ambulatory Visit: Payer: Self-pay | Admitting: Family

## 2015-02-05 ENCOUNTER — Ambulatory Visit (HOSPITAL_BASED_OUTPATIENT_CLINIC_OR_DEPARTMENT_OTHER): Payer: BLUE CROSS/BLUE SHIELD

## 2015-02-05 VITALS — BP 110/69 | HR 77 | Temp 98.2°F | Resp 18

## 2015-02-05 DIAGNOSIS — D62 Acute posthemorrhagic anemia: Secondary | ICD-10-CM

## 2015-02-05 DIAGNOSIS — D509 Iron deficiency anemia, unspecified: Secondary | ICD-10-CM | POA: Diagnosis not present

## 2015-02-05 MED ORDER — SODIUM CHLORIDE 0.9 % IV SOLN
510.0000 mg | Freq: Once | INTRAVENOUS | Status: AC
  Filled 2015-02-05: qty 17

## 2015-02-05 MED ORDER — SODIUM CHLORIDE 0.9 % IV SOLN
510.0000 mg | Freq: Once | INTRAVENOUS | Status: DC
  Administered 2015-02-05: 510 mg via INTRAVENOUS
  Filled 2015-02-05: qty 17

## 2015-02-05 NOTE — Patient Instructions (Signed)

## 2015-03-12 ENCOUNTER — Ambulatory Visit: Payer: BLUE CROSS/BLUE SHIELD | Admitting: Family

## 2015-03-12 ENCOUNTER — Other Ambulatory Visit: Payer: BLUE CROSS/BLUE SHIELD

## 2015-03-15 ENCOUNTER — Encounter: Payer: Self-pay | Admitting: Family

## 2015-03-15 ENCOUNTER — Other Ambulatory Visit (HOSPITAL_BASED_OUTPATIENT_CLINIC_OR_DEPARTMENT_OTHER): Payer: BLUE CROSS/BLUE SHIELD

## 2015-03-15 ENCOUNTER — Ambulatory Visit (HOSPITAL_BASED_OUTPATIENT_CLINIC_OR_DEPARTMENT_OTHER): Payer: BLUE CROSS/BLUE SHIELD | Admitting: Family

## 2015-03-15 VITALS — BP 112/85 | HR 97 | Temp 97.7°F | Resp 14 | Ht 65.0 in | Wt 115.0 lb

## 2015-03-15 DIAGNOSIS — D62 Acute posthemorrhagic anemia: Secondary | ICD-10-CM

## 2015-03-15 DIAGNOSIS — D560 Alpha thalassemia: Secondary | ICD-10-CM

## 2015-03-15 DIAGNOSIS — D473 Essential (hemorrhagic) thrombocythemia: Secondary | ICD-10-CM | POA: Diagnosis not present

## 2015-03-15 DIAGNOSIS — D509 Iron deficiency anemia, unspecified: Secondary | ICD-10-CM | POA: Diagnosis not present

## 2015-03-15 LAB — CBC WITH DIFFERENTIAL (CANCER CENTER ONLY)
BASO#: 0 10*3/uL (ref 0.0–0.2)
BASO%: 0.3 % (ref 0.0–2.0)
EOS%: 1.2 % (ref 0.0–7.0)
Eosinophils Absolute: 0.1 10*3/uL (ref 0.0–0.5)
HEMATOCRIT: 40.9 % (ref 34.8–46.6)
HGB: 14 g/dL (ref 11.6–15.9)
LYMPH#: 1.9 10*3/uL (ref 0.9–3.3)
LYMPH%: 19.8 % (ref 14.0–48.0)
MCH: 28.1 pg (ref 26.0–34.0)
MCHC: 34.2 g/dL (ref 32.0–36.0)
MCV: 82 fL (ref 81–101)
MONO#: 0.9 10*3/uL (ref 0.1–0.9)
MONO%: 9.7 % (ref 0.0–13.0)
NEUT#: 6.6 10*3/uL — ABNORMAL HIGH (ref 1.5–6.5)
NEUT%: 69 % (ref 39.6–80.0)
PLATELETS: 459 10*3/uL — AB (ref 145–400)
RBC: 4.98 10*6/uL (ref 3.70–5.32)
RDW: 16.5 % — ABNORMAL HIGH (ref 11.1–15.7)
WBC: 9.6 10*3/uL (ref 3.9–10.0)

## 2015-03-15 LAB — IRON AND TIBC CHCC
%SAT: 11 % — ABNORMAL LOW (ref 21–57)
Iron: 37 ug/dL — ABNORMAL LOW (ref 41–142)
TIBC: 346 ug/dL (ref 236–444)
UIBC: 309 ug/dL (ref 120–384)

## 2015-03-15 LAB — FERRITIN CHCC: FERRITIN: 87 ng/mL (ref 9–269)

## 2015-03-15 NOTE — Progress Notes (Signed)
Hematology and Oncology Follow Up Visit  Yalexa Blust [id number] 1961/07/27 54 y.o. 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 Dotter is here today for a follow-up. She received 2 doses of Feraheme in May and is now feeling much better. She is asymptomatic at this time and has no complaints.  Her Hgb has come up to 14 with an MCV of 82. Her platelet count has also come down nicely to 459. Overall, she has had a very nice response.  She denies fever, chills, n/v, cough, rash, dizziness, headaches, ice cravings, changes on bowel or bladder habits. No blood in urine or stool. 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 She has been on antibiotics on and off for this. There has been no swelling, tenderness, numbness or tingling in her extremities.  She is eating well and staying hydrated. Her weight is stable.    Medications:    Medication List       This list is accurate as of: 03/15/15  8:58 AM.  Always use your most recent med list.               fluconazole 150 MG tablet  Commonly known as:  DIFLUCAN     folic acid 1 MG tablet  Commonly known as:  FOLVITE  Take 1 tablet (1 mg total) by mouth daily.     HYDROcodone-acetaminophen 5-325 MG per tablet  Commonly known as:  NORCO/VICODIN  Take 1 tablet by mouth.     multivitamin with minerals tablet  Take 1 tablet by mouth daily.     mupirocin ointment 2 %  Commonly known as:  BACTROBAN  Apply to affected area 3 times daily        Allergies:  Allergies  Allergen Reactions  . Lactose Diarrhea and Nausea Only  . Lisinopril Cough    Past Medical History, Surgical history, Social history, and Family History were reviewed and updated.  Review of Systems: All other 10 point review of systems is negative.   Physical  Exam:  vitals were not taken for this visit.  Wt Readings from Last 3 Encounters:  01/26/15 117 lb (53.071 kg)  01/23/15 112 lb (50.803 kg)  05/15/13 152 lb 8 oz (69.174 kg)    Ocular: Sclerae unicteric, pupils equal, round and reactive to light Ear-nose-throat: Oropharynx clear, dentition fair Lymphatic: No cervical or supraclavicular adenopathy Lungs no rales or rhonchi, good excursion bilaterally Heart regular rate and rhythm, no murmur appreciated Abd soft, nontender, positive bowel sounds MSK no focal spinal tenderness, no joint edema Neuro: non-focal, well-oriented, appropriate affect Breasts: Deferred  Lab Results  Component Value Date   WBC 9.6 03/15/2015   HGB 14.0 03/15/2015   HCT 40.9 03/15/2015   MCV 82 03/15/2015   PLT 459* 03/15/2015   Lab Results  Component Value Date   FERRITIN 20 01/23/2015   IRON 14* 01/23/2015   TIBC 274 01/23/2015   UIBC 260 01/23/2015   IRONPCTSAT 5* 01/23/2015   Lab Results  Component Value Date   RETICCTPCT 2.6* 01/23/2015   RBC 4.98 03/15/2015   RETICCTABS 98.0 01/23/2015   No results found for: KPAFRELGTCHN, LAMBDASER, KAPLAMBRATIO No results found for: IGGSERUM, IGA, IGMSERUM No results found for: TOTALPROTELP, ALBUMINELP, A1GS, A2GS, BETS, BETA2SER, GAMS, MSPIKE, SPEI   Chemistry      Component  Value Date/Time   NA 139 05/16/2013 0435   K 3.7 05/16/2013 0435   CL 108 05/16/2013 0435   CO2 21 05/16/2013 0435   BUN 6 05/16/2013 0435   CREATININE 0.73 05/16/2013 0435      Component Value Date/Time   CALCIUM 8.4 05/16/2013 0435   ALKPHOS 181* 05/16/2013 0435   AST 36 05/16/2013 0435   ALT 18 05/16/2013 0435   BILITOT 0.4 05/16/2013 0435     Impression and Plan: Ms. Herbst is a very pleasant 54 yo African American female with a history of iron deficiency anemia, alpha thalassemia and thrombocytosis. She received 2 doses of Feraheme in May and is now feeling much better. She is asymptomatic at this time.   Her Hgb has corrected and with this her platelet count has come back down to 459. We will see what her iron studies show. I do not think she will need Feraheme at this time.   She is taking a multivitamin with folic acid in it daily.  We will plan to see her back in 3 months for labs and follow-up.  She knows to contact us with any questions or concerns. We can certainly see her sooner if need be.   Eliezer Bottom, NP 7/14/20168:58 AM

## 2015-03-22 ENCOUNTER — Other Ambulatory Visit (HOSPITAL_COMMUNITY): Payer: Self-pay | Admitting: Gastroenterology

## 2015-03-22 DIAGNOSIS — B182 Chronic viral hepatitis C: Secondary | ICD-10-CM

## 2015-03-28 ENCOUNTER — Other Ambulatory Visit: Payer: Self-pay | Admitting: Radiology

## 2015-03-29 ENCOUNTER — Ambulatory Visit (HOSPITAL_COMMUNITY)
Admission: RE | Admit: 2015-03-29 | Discharge: 2015-03-29 | Disposition: A | Discharge status: Home / Self Care | Payer: BLUE CROSS/BLUE SHIELD | Source: Ambulatory Visit | Attending: Gastroenterology | Admitting: Gastroenterology

## 2015-03-29 ENCOUNTER — Other Ambulatory Visit: Payer: Self-pay | Admitting: Physician Assistant

## 2015-03-29 ENCOUNTER — Encounter (HOSPITAL_COMMUNITY): Payer: Self-pay

## 2015-03-29 DIAGNOSIS — Z833 Family history of diabetes mellitus: Secondary | ICD-10-CM | POA: Insufficient documentation

## 2015-03-29 DIAGNOSIS — D560 Alpha thalassemia: Secondary | ICD-10-CM | POA: Insufficient documentation

## 2015-03-29 DIAGNOSIS — Z8379 Family history of other diseases of the digestive system: Secondary | ICD-10-CM | POA: Insufficient documentation

## 2015-03-29 DIAGNOSIS — B182 Chronic viral hepatitis C: Secondary | ICD-10-CM | POA: Insufficient documentation

## 2015-03-29 DIAGNOSIS — D509 Iron deficiency anemia, unspecified: Secondary | ICD-10-CM | POA: Insufficient documentation

## 2015-03-29 DIAGNOSIS — R740 Nonspecific elevation of levels of transaminase and lactic acid dehydrogenase [LDH]: Secondary | ICD-10-CM | POA: Diagnosis present

## 2015-03-29 DIAGNOSIS — K649 Unspecified hemorrhoids: Secondary | ICD-10-CM

## 2015-03-29 DIAGNOSIS — D473 Essential (hemorrhagic) thrombocythemia: Secondary | ICD-10-CM | POA: Diagnosis not present

## 2015-03-29 DIAGNOSIS — I10 Essential (primary) hypertension: Secondary | ICD-10-CM | POA: Insufficient documentation

## 2015-03-29 HISTORY — DX: Other specified postprocedural states: Z98.890

## 2015-03-29 HISTORY — DX: Unspecified hemorrhoids: K64.9

## 2015-03-29 HISTORY — DX: Other specified postprocedural states: R11.2

## 2015-03-29 HISTORY — DX: Toxic effect of unspecified spider venom, accidental (unintentional), initial encounter: T63.301A

## 2015-03-29 LAB — CBC
HEMATOCRIT: 36.4 % (ref 36.0–46.0)
Hemoglobin: 12.1 g/dL (ref 12.0–15.0)
MCH: 27.1 pg (ref 26.0–34.0)
MCHC: 33.2 g/dL (ref 30.0–36.0)
MCV: 81.6 fL (ref 78.0–100.0)
PLATELETS: 509 10*3/uL — AB (ref 150–400)
RBC: 4.46 MIL/uL (ref 3.87–5.11)
RDW: 15.1 % (ref 11.5–15.5)
WBC: 10.8 10*3/uL — ABNORMAL HIGH (ref 4.0–10.5)

## 2015-03-29 LAB — PROTIME-INR
INR: 1.08 (ref 0.00–1.49)
PROTHROMBIN TIME: 14.2 s (ref 11.6–15.2)

## 2015-03-29 LAB — APTT: aPTT: 27 seconds (ref 24–37)

## 2015-03-29 MED ORDER — SODIUM CHLORIDE 0.9 % IV SOLN: INTRAVENOUS | Status: DC

## 2015-03-29 MED ORDER — FLUMAZENIL 0.5 MG/5ML IV SOLN
INTRAVENOUS | Status: AC
  Filled 2015-03-29: qty 5

## 2015-03-29 MED ORDER — MIDAZOLAM HCL 2 MG/2ML IJ SOLN
INTRAMUSCULAR | Status: AC | PRN
  Administered 2015-03-29 (×2): 0.5 mg via INTRAVENOUS
  Administered 2015-03-29: 1 mg via INTRAVENOUS

## 2015-03-29 MED ORDER — NALOXONE HCL 0.4 MG/ML IJ SOLN
INTRAMUSCULAR | Status: AC
  Filled 2015-03-29: qty 1

## 2015-03-29 MED ORDER — FENTANYL CITRATE (PF) 100 MCG/2ML IJ SOLN
INTRAMUSCULAR | Status: AC | PRN
  Administered 2015-03-29 (×4): 25 ug via INTRAVENOUS

## 2015-03-29 MED ORDER — FENTANYL CITRATE (PF) 100 MCG/2ML IJ SOLN
INTRAMUSCULAR | Status: AC
  Filled 2015-03-29: qty 2

## 2015-03-29 MED ORDER — MIDAZOLAM HCL 2 MG/2ML IJ SOLN
INTRAMUSCULAR | Status: AC
  Filled 2015-03-29: qty 4

## 2015-03-29 NOTE — Discharge Instructions (Signed)
Liver Biopsy, Care After These instructions give you information on caring for yourself after your procedure. Your doctor may also give you more specific instructions. Call your doctor if you have any problems or questions after your procedure. HOME CARE  Rest at home for 1-2 days or as told by your doctor.  Have someone stay with you for at least 24 hours.  Do not do these things in the first 24 hours:  Drive.  Use machinery.  Take care of other people.  Sign legal documents.  Take a bath or shower.  There are many different ways to close and cover a cut (incision). For example, a cut can be closed with stitches, skin glue, or adhesive strips. Follow your doctor's instructions on:  Taking care of your cut.  Changing and removing your bandage (dressing).  Removing whatever was used to close your cut.  Do not drink alcohol in the first week.  Do not lift more than 5 pounds or play contact sports for the first 2 weeks.  Take medicines only as told by your doctor. For 1 week, do not take medicine that has aspirin in it or medicines like ibuprofen.  Get your test results. GET HELP IF:  A cut bleeds and leaves more than just a small spot of blood.  A cut is red, puffs up (swells), or hurts more than before.  Fluid or something else comes from a cut.  A cut smells bad.  You have a fever or chills. GET HELP RIGHT AWAY IF:  You have swelling, bloating, or pain in your belly (abdomen).  You get dizzy or faint.  You have a rash.  You feel sick to your stomach (nauseous) or throw up (vomit).  You have trouble breathing, feel short of breath, or feel faint.  Your chest hurts.  You have problems talking or seeing.  You have trouble balancing or moving your arms or legs. Document Released: 05/27/2008 Document Revised: 01/02/2014 Document Reviewed: 10/14/2013 [facility] Patient Information 2015 [place], [place]. This information is not intended to replace advice given to  you by your health care provider. Make sure you discuss any questions you have with your health care provider. Liver Biopsy The liver is a large organ in the upper right-hand side of your abdomen. A liver biopsy is a procedure in which a tissue sample is taken from the liver and examined under a microscope. The procedure is done to confirm a suspected problem. There are three types of liver biopsies:  Percutaneous. In this type, an incision is made in your abdomen. The sample is removed through the incision with a needle.  Laparoscopic. In this type, several incisions are made in the abdomen. A tiny camera is passed through one of the incisions to help guide the health care provider. The sample is removed through the other incision or incisions.  Transjugular. In this type, an incision is made in the neck. A tube is passed through the incision to the liver. The sample is removed through the tube with a needle. LET [facility] CARE PROVIDER KNOW ABOUT:  Any allergies you have.  All medicines you are taking, including vitamins, herbs, eye drops, creams, and over-the-counter medicines.  Previous problems you or members of your family have had with the use of anesthetics.  Any blood disorders you have.  Previous surgeries you have had.  Medical conditions you have.  Possibility of pregnancy, if this applies. RISKS AND COMPLICATIONS Generally, this is a safe procedure. However, problems can  occur and include:  Bleeding.  Infection.  Bruising.  Collapsed lung.  Leak of digestive juices (bile) from the liver or gallbladder.  Problems with heart rhythm.  Pain at the biopsy site or in the right shoulder.  Low blood pressure (hypotension).  Injury to nearby organs or tissues. BEFORE THE PROCEDURE  Your health care provider may do some blood or urine tests. These will help your health care provider learn how well your kidneys and liver are working and how well your blood  clots.  Ask your health care provider if you will be able to go home the day of the procedure. Arrange for someone to take you home and stay with you for at least 24 hours.  Do not eat or drink anything after midnight on the night before the procedure or as directed by your health care provider.  Ask your health care provider about:  Changing or stopping your regular medicines. This is especially important if you are taking diabetes medicines or blood thinners.  Taking medicines such as aspirin and ibuprofen. These medicines can thin your blood. Do not take these medicines before your procedure if your health care provider asks you not to. PROCEDURE Regardless of the type of biopsy that will be done, you will have an IV line placed. Through this line, you will receive fluids and medicine to relax you. If you will be having a laparoscopic biopsy, you may also receive medicine through this line to make you sleep during the procedure (general anesthetic). Percutaneous Liver Biopsy  You will positioned on your back, with your right hand over your head.  A health care provider will locate your liver by tapping and pressing on the right side of your abdomen or with the help of an ultrasound machine or CT scan.  An area at the bottom of your last right rib will be numbed.  An incision will be made in the numbed area.  The biopsy needle will be inserted into the incision.  Several samples of liver tissue will be taken with the biopsy needle. You will be asked to hold your breath as each sample is taken. Laparoscopic Liver Biopsy  You will be positioned on your back.  Several small incisions will be made in your abdomen.  Your doctor will pass a tiny camera through one incision. The camera will allow the liver to be viewed on a TV monitor in the operating room.  Tools will be passed through the other incision or incisions. These tools will be used to remove samples of liver  tissue. Transjugular Liver Biopsy  You will be positioned on your back on an X-ray table, with your head turned to your left.  An area on your neck just over your jugular vein will be numbed.  An incision will be made in the numbed area.  A tiny tube will be inserted through the incision. It will be pushed through the jugular vein to a blood vessel in the liver called the hepatic vein.  Dye will be inserted through the tube, and X-rays will be taken. The dye will make the blood vessels in the liver light up on the X-rays.  The biopsy needle will be pushed through the tube until it reaches the liver.  Samples of liver tissue will be taken with the biopsy needle.  The needle and the tube will be removed. After the samples are obtained, the incision or incisions will be closed. AFTER THE PROCEDURE  You will be taken to  a recovery area.  You may have to lie on your right side for 1-2 hours. This will prevent bleeding from the biopsy site.  Your progress will be watched. Your blood pressure, pulse, and the biopsy site will be checked often.  You may have some pain or feel sick. If this happens, tell your health care provider.  As you begin to feel better, you will be offered ice and beverages.  You may be allowed to go home when the medicines have worn off and you can walk, drink, eat, and use the bathroom. Document Released: 11/08/2003 Document Revised: 01/02/2014 Document Reviewed: 10/14/2013 [facility] Patient Information 2015 [place], [place]. This information is not intended to replace advice given to you by your health care provider. Make sure you discuss any questions you have with your health care provider.  Conscious Sedation, Adult, Care After Refer to this sheet in the next few weeks. These instructions provide you with information on caring for yourself after your procedure. Your health care provider may also give you more specific instructions. Your treatment has been planned  according to current medical practices, but problems sometimes occur. Call your health care provider if you have any problems or questions after your procedure. WHAT TO EXPECT AFTER THE PROCEDURE  After your procedure:  You may feel sleepy, clumsy, and have poor balance for several hours.  Vomiting may occur if you eat too soon after the procedure. HOME CARE INSTRUCTIONS  Do not participate in any activities where you could become injured for at least 24 hours. Do not:  Drive.  Swim.  Ride a bicycle.  Operate heavy machinery.  Cook.  Use power tools.  Climb ladders.  Work from a high place.  Do not make important decisions or sign legal documents until you are improved.  If you vomit, drink water, juice, or soup when you can drink without vomiting. Make sure you have little or no nausea before eating solid foods.  Only take over-the-counter or prescription medicines for pain, discomfort, or fever as directed by your health care provider.  Make sure you and your family fully understand everything about the medicines given to you, including what side effects may occur.  You should not drink alcohol, take sleeping pills, or take medicines that cause drowsiness for at least 24 hours.  If you smoke, do not smoke without supervision.  If you are feeling better, you may resume normal activities 24 hours after you were sedated.  Keep all appointments with your health care provider. SEEK MEDICAL CARE IF:  Your skin is pale or bluish in color.  You continue to feel nauseous or vomit.  Your pain is getting worse and is not helped by medicine.  You have bleeding or swelling.  You are still sleepy or feeling clumsy after 24 hours. SEEK IMMEDIATE MEDICAL CARE IF:  You develop a rash.  You have difficulty breathing.  You develop any type of allergic problem.  You have a fever. MAKE SURE YOU:  Understand these instructions.  Will watch your condition.  Will get  help right away if you are not doing well or get worse. Document Released: 06/08/2013 Document Reviewed: 06/08/2013 [facility] Patient Information 2015 [place], [place]. This information is not intended to replace advice given to you by your health care provider. Make sure you discuss any questions you have with your health care provider.

## 2015-03-29 NOTE — Procedures (Signed)
Interventional Radiology Procedure Note  Procedure: US guided medical liver biopsy.  Right lobe.  3 x 18G core..  Complications: None Recommendations:  - Ok to shower tomorrow - Do not submerge for 7 days - Routine care   Signed,  Dulcy Fanny. Earleen Newport, DO

## 2015-03-29 NOTE — H&P (Signed)
Chief Complaint: Patient was seen in consultation today for US guided random liver biopsy at the request of Outlaw,William  Referring Physician(s): Outlaw,William  History of Present Illness: Abigail Beck is a 54 y.o. female with history of hepatitis C, elevated autoimmune markers, significant elevation of ALP, ACE levels. She also has hx of iron deficiency anemia, alpha thalassemia and thrombocytosis. She presents today for US guided random liver biopsy for further evaluation. Her last liver bx was approximately 2 years ago at [facility].   Past Medical History  Diagnosis Date  . Hepatitis C ~2011/2012    Incidental with physical; unclear etiology  . Fibroids   . Hypertension     Situational, diet & exercise controlled  . Hay fever   . Spider bite 01/01/15    treated x 2 months left lower leg ? possible brown recluse   . Hemorrhoids 03/29/15    some bleeding today  . PONV (postoperative nausea and vomiting)     Past Surgical History  Procedure Laterality Date  . Tubal ligation      Allergies: Lactose and Lisinopril  Medications: Prior to Admission medications   Medication Sig Start Date End Date Taking? Authorizing Provider  cholecalciferol (VITAMIN D) 1000 UNITS tablet Take 1,000 Units by mouth daily.   Yes Historical Provider, MD  folic acid (FOLVITE) 1 MG tablet Take 1 tablet (1 mg total) by mouth daily. 01/26/15  Yes Eliezer Bottom, NP  Multiple Vitamins-Minerals (MULTIVITAMIN WITH MINERALS) tablet Take 1 tablet by mouth daily.   Yes Historical Provider, MD  Omega-3 Fatty Acids (OMEGA 3 PO) Take by mouth.   Yes Historical Provider, MD  vitamin B-12 (CYANOCOBALAMIN) 50 MCG tablet Take 50 mcg by mouth daily.   Yes Historical Provider, MD     Family History  Problem Relation Age of Onset  . Alzheimer's disease Mother   . Diabetes Mother   . Glucose-6-phos deficiency Son   . Healthy Father     81 yo without medical problems  . Healthy  Paternal Grandmother     42 yo without medical problems  . Liver disease Sister     ?Fatty liver disease    History   Social History  . Marital Status: Single    Spouse Name: N/A  . Number of Children: N/A  . Years of Education: college   Occupational History  . pharmceutical representative    Social History Main Topics  . Smoking status: Never Smoker   . Smokeless tobacco: Never Used     Comment: NEVER USED TOBACCO  . Alcohol Use: No  . Drug Use: No  . Sexual Activity: No     Comment: divorced around 2006, not sexually active since   Other Topics Concern  . None   Social History Narrative   Lives alone, son is in [place] (Hotel manager at [employer]  Constitutional: Negative for fever and chills.  Respiratory: Negative for shortness of breath.        Occ cough  Cardiovascular: Negative for chest pain.  Gastrointestinal: Negative for nausea and vomiting.       Occ abd pain, diarrhea; some occ hemorrhoidal bleeding  Genitourinary: Negative for dysuria and hematuria.  Musculoskeletal:       Occ back pain  Neurological: Negative for headaches.    Vital Signs: BP 128/90  HR 104  R 16  TEMP 97.8  O2 SATS 100% RA Physical Exam  Constitutional: She is  oriented to person, place, and time. She appears well-developed and well-nourished.  Cardiovascular: Regular rhythm.   Murmur heard. Sl tachy but regular  Pulmonary/Chest: Effort normal and breath sounds normal.  Abdominal: Soft. Bowel sounds are normal.  Mild LUQ tenderness to palpation  Musculoskeletal: Normal range of motion. She exhibits no edema.  Spider bite LLE- site covered with bandage  Neurological: She is alert and oriented to person, place, and time.    Mallampati Score:     Imaging: No results found.  Labs:  CBC:  Recent Labs  01/23/15 0846 03/15/15 0811 03/29/15 1038  WBC 8.9 9.6 10.8*  HGB 8.6* 14.0 12.1  HCT 27.8* 40.9 36.4  PLT 867* 459* 509*     COAGS:  Recent Labs  03/29/15 1038  INR 1.08  APTT 27    BMP: No results for input(s): NA, K, CL, CO2, GLUCOSE, BUN, CALCIUM, CREATININE, GFRNONAA, GFRAA in the last 8760 hours.  Invalid input(s): CMP  LIVER FUNCTION TESTS: No results for input(s): BILITOT, AST, ALT, ALKPHOS, PROT, ALBUMIN in the last 8760 hours.  TUMOR MARKERS: No results for input(s): AFPTM, CEA, CA199, CHROMGRNA in the last 8760 hours.  Assessment and Plan: Ernesta Trabert is a 54 y.o. female with history of hepatitis C, elevated autoimmune markers, significant elevation of ALP, ACE levels. She also has hx of iron deficiency anemia, alpha thalassemia and thrombocytosis. She presents today for US guided random liver biopsy for further evaluation. Her last liver bx was approximately 2 years ago at [facility] and benefits discussed with the patient/daughter including, but not limited to bleeding, infection, damage to adjacent structures or low yield requiring additional tests.All of the patient's questions were answered, patient is agreeable to proceed.Consent signed and in chart.       Signed: D. Rowe Robert 03/29/2015, 11:31 AM   I spent a total of 30 minutes in face to face in clinical consultation, greater than 50% of which was counseling/coordinating care for US guided random liver biopsy

## 2015-04-15 ENCOUNTER — Inpatient Hospital Stay (HOSPITAL_COMMUNITY)
Admission: EM | Admit: 2015-04-15 | Discharge: 2015-04-19 | DRG: 391 | Disposition: A | Discharge status: Home / Self Care | Payer: BLUE CROSS/BLUE SHIELD | Attending: Internal Medicine | Admitting: Internal Medicine

## 2015-04-15 ENCOUNTER — Encounter (HOSPITAL_COMMUNITY): Payer: Self-pay | Admitting: *Deleted

## 2015-04-15 ENCOUNTER — Inpatient Hospital Stay (HOSPITAL_COMMUNITY): Payer: BLUE CROSS/BLUE SHIELD

## 2015-04-15 DIAGNOSIS — N179 Acute kidney failure, unspecified: Secondary | ICD-10-CM | POA: Diagnosis present

## 2015-04-15 DIAGNOSIS — B182 Chronic viral hepatitis C: Secondary | ICD-10-CM | POA: Diagnosis present

## 2015-04-15 DIAGNOSIS — E870 Hyperosmolality and hypernatremia: Secondary | ICD-10-CM | POA: Diagnosis present

## 2015-04-15 DIAGNOSIS — E871 Hypo-osmolality and hyponatremia: Secondary | ICD-10-CM | POA: Diagnosis present

## 2015-04-15 DIAGNOSIS — Z79899 Other long term (current) drug therapy: Secondary | ICD-10-CM | POA: Diagnosis not present

## 2015-04-15 DIAGNOSIS — Z888 Allergy status to other drugs, medicaments and biological substances status: Secondary | ICD-10-CM

## 2015-04-15 DIAGNOSIS — E876 Hypokalemia: Secondary | ICD-10-CM | POA: Diagnosis present

## 2015-04-15 DIAGNOSIS — K649 Unspecified hemorrhoids: Secondary | ICD-10-CM | POA: Diagnosis present

## 2015-04-15 DIAGNOSIS — E860 Dehydration: Secondary | ICD-10-CM | POA: Diagnosis present

## 2015-04-15 DIAGNOSIS — E43 Unspecified severe protein-calorie malnutrition: Secondary | ICD-10-CM | POA: Insufficient documentation

## 2015-04-15 DIAGNOSIS — K5090 Crohn's disease, unspecified, without complications: Secondary | ICD-10-CM

## 2015-04-15 DIAGNOSIS — D560 Alpha thalassemia: Secondary | ICD-10-CM | POA: Diagnosis present

## 2015-04-15 DIAGNOSIS — R1084 Generalized abdominal pain: Secondary | ICD-10-CM

## 2015-04-15 DIAGNOSIS — D473 Essential (hemorrhagic) thrombocythemia: Secondary | ICD-10-CM | POA: Diagnosis present

## 2015-04-15 DIAGNOSIS — D509 Iron deficiency anemia, unspecified: Secondary | ICD-10-CM | POA: Diagnosis present

## 2015-04-15 DIAGNOSIS — R197 Diarrhea, unspecified: Secondary | ICD-10-CM | POA: Diagnosis present

## 2015-04-15 DIAGNOSIS — K5080 Crohn's disease of both small and large intestine without complications: Secondary | ICD-10-CM | POA: Diagnosis present

## 2015-04-15 DIAGNOSIS — E869 Volume depletion, unspecified: Secondary | ICD-10-CM

## 2015-04-15 DIAGNOSIS — A09 Infectious gastroenteritis and colitis, unspecified: Principal | ICD-10-CM | POA: Diagnosis present

## 2015-04-15 DIAGNOSIS — I10 Essential (primary) hypertension: Secondary | ICD-10-CM | POA: Diagnosis present

## 2015-04-15 DIAGNOSIS — D75839 Thrombocytosis, unspecified: Secondary | ICD-10-CM | POA: Diagnosis present

## 2015-04-15 DIAGNOSIS — E861 Hypovolemia: Secondary | ICD-10-CM | POA: Diagnosis present

## 2015-04-15 LAB — COMPREHENSIVE METABOLIC PANEL
ALK PHOS: 231 U/L — AB (ref 38–126)
ALT: 17 U/L (ref 14–54)
AST: 22 U/L (ref 15–41)
Albumin: 1.9 g/dL — ABNORMAL LOW (ref 3.5–5.0)
Anion gap: 13 (ref 5–15)
BUN: 18 mg/dL (ref 6–20)
CALCIUM: 8.9 mg/dL (ref 8.9–10.3)
CO2: 24 mmol/L (ref 22–32)
CREATININE: 1.2 mg/dL — AB (ref 0.44–1.00)
Chloride: 91 mmol/L — ABNORMAL LOW (ref 101–111)
GFR calc Af Amer: 59 mL/min — ABNORMAL LOW (ref >60)
GFR calc non Af Amer: 51 mL/min — ABNORMAL LOW (ref >60)
Glucose, Bld: 117 mg/dL — ABNORMAL HIGH (ref 65–99)
POTASSIUM: 3.4 mmol/L — AB (ref 3.5–5.1)
Sodium: 128 mmol/L — ABNORMAL LOW (ref 135–145)
Total Bilirubin: 0.6 mg/dL (ref 0.3–1.2)
Total Protein: 7.3 g/dL (ref 6.5–8.1)

## 2015-04-15 LAB — CBC
HCT: 34.9 % — ABNORMAL LOW (ref 36.0–46.0)
Hemoglobin: 11.9 g/dL — ABNORMAL LOW (ref 12.0–15.0)
MCH: 26.7 pg (ref 26.0–34.0)
MCHC: 34.1 g/dL (ref 30.0–36.0)
MCV: 78.4 fL (ref 78.0–100.0)
Platelets: 792 10*3/uL — ABNORMAL HIGH (ref 150–400)
RBC: 4.45 MIL/uL (ref 3.87–5.11)
RDW: 13.8 % (ref 11.5–15.5)
WBC: 14.5 10*3/uL — AB (ref 4.0–10.5)

## 2015-04-15 LAB — POC OCCULT BLOOD, ED: Fecal Occult Bld: POSITIVE — AB

## 2015-04-15 LAB — URINALYSIS, ROUTINE W REFLEX MICROSCOPIC
BILIRUBIN URINE: NEGATIVE
GLUCOSE, UA: NEGATIVE mg/dL
KETONES UR: NEGATIVE mg/dL
Nitrite: NEGATIVE
Protein, ur: 30 mg/dL — AB
Specific Gravity, Urine: 1.015 (ref 1.005–1.030)
Urobilinogen, UA: 0.2 mg/dL (ref 0.0–1.0)
pH: 6 (ref 5.0–8.0)

## 2015-04-15 LAB — URINE MICROSCOPIC-ADD ON

## 2015-04-15 LAB — CLOSTRIDIUM DIFFICILE BY PCR: CDIFFPCR: NEGATIVE

## 2015-04-15 LAB — LIPASE, BLOOD: Lipase: 17 U/L — ABNORMAL LOW (ref 22–51)

## 2015-04-15 MED ORDER — IOHEXOL 300 MG/ML  SOLN
100.0000 mL | Freq: Once | INTRAMUSCULAR | Status: AC | PRN
  Administered 2015-04-15: 100 mL via INTRAVENOUS

## 2015-04-15 MED ORDER — IOHEXOL 300 MG/ML  SOLN
50.0000 mL | Freq: Once | INTRAMUSCULAR | Status: AC | PRN
  Administered 2015-04-15: 50 mL via ORAL

## 2015-04-15 MED ORDER — MULTI-VITAMIN/MINERALS PO TABS: 1.0000 | ORAL_TABLET | Freq: Every day | ORAL | Status: DC

## 2015-04-15 MED ORDER — SODIUM CHLORIDE 0.9 % IV BOLUS (SEPSIS)
1000.0000 mL | Freq: Once | INTRAVENOUS | Status: AC
  Administered 2015-04-15: 1000 mL via INTRAVENOUS

## 2015-04-15 MED ORDER — ONDANSETRON HCL 4 MG PO TABS: 4.0000 mg | ORAL_TABLET | Freq: Four times a day (QID) | ORAL | Status: DC | PRN

## 2015-04-15 MED ORDER — VITAMIN B-12 100 MCG PO TABS
50.0000 ug | ORAL_TABLET | Freq: Every day | ORAL | Status: DC
  Administered 2015-04-16 – 2015-04-19 (×4): 50 ug via ORAL
  Filled 2015-04-15 (×4): qty 1

## 2015-04-15 MED ORDER — CIPROFLOXACIN IN D5W 400 MG/200ML IV SOLN
400.0000 mg | Freq: Three times a day (TID) | INTRAVENOUS | Status: DC
  Administered 2015-04-15 – 2015-04-17 (×6): 400 mg via INTRAVENOUS
  Filled 2015-04-15 (×8): qty 200

## 2015-04-15 MED ORDER — VITAMIN B-12 100 MCG PO TABS
50.0000 ug | ORAL_TABLET | Freq: Every day | ORAL | Status: DC
Start: 2015-04-15 — End: 2015-04-15
  Filled 2015-04-15: qty 1

## 2015-04-15 MED ORDER — MESALAMINE 1.2 G PO TBEC
4.8000 g | DELAYED_RELEASE_TABLET | Freq: Every day | ORAL | Status: DC
  Administered 2015-04-16 – 2015-04-19 (×4): 4.8 g via ORAL
  Filled 2015-04-15 (×6): qty 4

## 2015-04-15 MED ORDER — ONDANSETRON HCL 4 MG/2ML IJ SOLN: 4.0000 mg | Freq: Four times a day (QID) | INTRAMUSCULAR | Status: DC | PRN

## 2015-04-15 MED ORDER — ACETAMINOPHEN 500 MG PO TABS
500.0000 mg | ORAL_TABLET | Freq: Four times a day (QID) | ORAL | Status: DC | PRN
  Administered 2015-04-16: 500 mg via ORAL
  Filled 2015-04-15: qty 1

## 2015-04-15 MED ORDER — DICYCLOMINE HCL 10 MG/ML IM SOLN
20.0000 mg | Freq: Once | INTRAMUSCULAR | Status: AC
  Administered 2015-04-15: 20 mg via INTRAMUSCULAR
  Filled 2015-04-15: qty 2

## 2015-04-15 MED ORDER — IOHEXOL 300 MG/ML  SOLN
80.0000 mL | Freq: Once | INTRAMUSCULAR | Status: AC | PRN
  Administered 2015-04-15: 80 mL via INTRAVENOUS

## 2015-04-15 MED ORDER — BOOST / RESOURCE BREEZE PO LIQD
1.0000 | Freq: Three times a day (TID) | ORAL | Status: DC
  Administered 2015-04-16 – 2015-04-18 (×3): 1 via ORAL

## 2015-04-15 MED ORDER — FOLIC ACID 1 MG PO TABS
1.0000 mg | ORAL_TABLET | Freq: Every day | ORAL | Status: DC
  Administered 2015-04-16 – 2015-04-19 (×4): 1 mg via ORAL
  Filled 2015-04-15 (×4): qty 1

## 2015-04-15 MED ORDER — ADULT MULTIVITAMIN W/MINERALS CH
1.0000 | ORAL_TABLET | Freq: Every day | ORAL | Status: DC
  Administered 2015-04-16 – 2015-04-19 (×4): 1 via ORAL
  Filled 2015-04-15 (×4): qty 1

## 2015-04-15 MED ORDER — METRONIDAZOLE IN NACL 5-0.79 MG/ML-% IV SOLN
500.0000 mg | Freq: Three times a day (TID) | INTRAVENOUS | Status: DC
  Administered 2015-04-16 – 2015-04-18 (×8): 500 mg via INTRAVENOUS
  Filled 2015-04-15 (×9): qty 100

## 2015-04-15 MED ORDER — POTASSIUM CHLORIDE IN NACL 40-0.9 MEQ/L-% IV SOLN
INTRAVENOUS | Status: DC
  Administered 2015-04-15 – 2015-04-19 (×7): 100 mL/h via INTRAVENOUS
  Filled 2015-04-15 (×13): qty 1000

## 2015-04-15 NOTE — H&P (Addendum)
Referring Provider: Dr. Jeanell Sparrow Primary Care Physician:  Shellia Carwin, PA-C Primary Gastroenterologist:  Dr. Paulita Fujita  Reason for Consultation:  Diarrhea  HPI: Abigail Beck is a 54 y.o. female is a patient with chronic Hep C with grade 2 activity and stage 2 fibrosis on liver biopsy in July who had a colonoscopy in 2014 that showed ulcerative pancolitis and ileitis thought to represent Crohn's disease. She was placed on Apriso at that time but reports having an allergic reaction to it and denies being on any meds following that. She has been having semi-formed stool until 3 weeks ago when she developed profuse bloody diarrhea each day. Reports left-sided abdominal pain that is crampy. 40 pound unintentional weight loss in the past year. Denies N/V/fevers/chills. On antibiotics earlier this summer for a presumed spider bite (not confirmed per her report) on her leg. Denies alcohol. Placed on Lialda last week by Dr. Paulita Fujita. Had been taking NSAIDs until she saw Dr. Paulita Fujita and since that visit has used Tylenol prn.   Past Medical History  Diagnosis Date  . Hepatitis C ~2011/2012    Incidental with physical; unclear etiology  . Fibroids   . Hypertension     Situational, diet & exercise controlled  . Hay fever   . Spider bite 01/01/15    treated x 2 months left lower leg ? possible brown recluse   . Hemorrhoids 03/29/15    some bleeding today  . PONV (postoperative nausea and vomiting)     Past Surgical History  Procedure Laterality Date  . Tubal ligation      Prior to Admission medications   Medication Sig Start Date End Date Taking? Authorizing Provider  acetaminophen (TYLENOL) 500 MG tablet Take 500 mg by mouth every 6 (six) hours as needed for mild pain or moderate pain.   Yes Historical Provider, MD  cholecalciferol (VITAMIN D) 1000 UNITS tablet Take 1,000 Units by mouth daily.   Yes Historical Provider, MD  folic acid (FOLVITE) 1 MG tablet Take 1 tablet (1 mg total) by mouth  daily. 01/26/15  Yes Eliezer Bottom, NP  Multiple Vitamins-Minerals (MULTIVITAMIN WITH MINERALS) tablet Take 1 tablet by mouth daily.   Yes Historical Provider, MD  Omega-3 Fatty Acids (OMEGA 3 PO) Take 1 tablet by mouth daily.    Yes Historical Provider, MD  vitamin B-12 (CYANOCOBALAMIN) 50 MCG tablet Take 50 mcg by mouth daily.   Yes Historical Provider, MD    Scheduled Meds: Continuous Infusions: PRN Meds:.  Allergies as of 04/15/2015 - Review Complete 04/15/2015  Allergen Reaction Noted  . Lactose Diarrhea and Nausea Only 01/23/2015  . Lisinopril Cough 05/15/2013    Family History  Problem Relation Age of Onset  . Alzheimer's disease Mother   . Diabetes Mother   . Glucose-6-phos deficiency Son   . Healthy Father     52 yo without medical problems  . Healthy Paternal Grandmother     34 yo without medical problems  . Liver disease Sister     ?Fatty liver disease    Social History   Social History  . Marital Status: Single    Spouse Name: N/A  . Number of Children: N/A  . Years of Education: college   Occupational History  . pharmceutical representative    Social History Main Topics  . Smoking status: Never Smoker   . Smokeless tobacco: Never Used     Comment: NEVER USED TOBACCO  . Alcohol Use: No  . Drug Use: No  .  Sexual Activity: No     Comment: divorced around 2006, not sexually active since   Other Topics Concern  . Not on file   Social History Narrative   Lives alone, son is in [place] (Hotel manager at [employer]: All negative except as stated above in HPI.  Physical Exam: Vital signs: Filed Vitals:   04/15/15 1145  BP: 142/89  Pulse: 94  Temp: 97.3        General:   Thin, awake, uncomfortable, alert, pleasant and cooperative Head: atraumatic Eyes: anicteric ENT: oropharynx clear Neck: supple, nontender Lungs:  Clear throughout to auscultation.   No wheezes, crackles, or rhonchi. No acute distress. Heart:   Regular rate and rhythm; no murmurs, clicks, rubs,  or gallops. Abdomen: left-sided tenderness (greatest in LLQ) with guarding, soft, nondistended, +BS  Rectal:  Deferred Ext: pulses intact, no edema Skin: no rash Psych: normal mood and affect  GI:  Lab Results:  Recent Labs  04/15/15 0805  WBC 14.5*  HGB 11.9*  HCT 34.9*  PLT 792*   BMET  Recent Labs  04/15/15 0805  NA 128*  K 3.4*  CL 91*  CO2 24  GLUCOSE 117*  BUN 18  CREATININE 1.20*  CALCIUM 8.9   LFT  Recent Labs  04/15/15 0805  PROT 7.3  ALBUMIN 1.9*  AST 22  ALT 17  ALKPHOS 231*  BILITOT 0.6   PT/INR No results for input(s): LABPROT, INR in the last 72 hours.   Studies/Results: No results found.  Impression/Plan: 54 yo with multiple episodes of bloody diarrhea for the past 3 weeks in the setting of Crohn's ileocolitis diagnosed in 2014. She also has Hep C with fibrosis on a recent liver biopsy and has not been treated for her Hep C in the past. Recent extensive use of antibiotics this summer for a presumed spider bite. Underlying infectious colitis could be causing her diarrhea especially in the setting of inflammatory bowel disease. C. Diff negative. Will check GI stool pathogen panel. Needs abd/pelvis CT to assess for active small bowel and colonic inflammation. Will have a low threshold for starting IV steroids but await stool studies unless CT shows small bowel and colonic involvement. Start IV Abx (Cipro/Flagyl) after collection of stool. If symptoms persisting and stool studies negative, then may need an updated colonoscopy. Resume Lialda 4.8 g/day starting tomorrow. Will follow.    LOS: 0 days   [place] C.  04/15/2015, 2:37 PM  Pager [phone]  If no answer or after 5 PM call [phone]

## 2015-04-15 NOTE — ED Notes (Signed)
Pt reports abd cramping, nausea and watery diarrhea x 2 weeks. Pt had recent antibiotic treatment for spider bite.

## 2015-04-15 NOTE — ED Notes (Signed)
Admitting MD at bedside.

## 2015-04-15 NOTE — Consult Note (Signed)
[facility] History and Physical                                                                                    Abigail Beck, is a 54 y.o. female  MRN: [id number]   DOB - 1961/07/23  Admit Date - 04/15/2015  Outpatient Primary MD for the patient is Shellia Carwin, PA-C  Referring Physician:  Dr. Jeanell Sparrow  This is a patient of regional physicians and thus is unassigned to [facility].  Chief Complaint:   Chief Complaint  Patient presents with  . Abdominal Pain  . Emesis  . Diarrhea     HPI  Abigail Beck  is a 54 y.o. female, with all for thalassemia, idiopathic hep C, possible Crohn's colitis and hemorrhoids. Who presents to the emergency department today with diarrhea. The patient reports that she had a spider bite in May and has gone through several rounds of antibiotics. For the past 4 weeks she has had diarrhea. She describes her bowel movements as tiny squirts of liquid stool, and states that she feels as though she has incomplete evacuation. She has the sensation of tenesmus. She also complains of bleeding but believes that is due to hemorrhoidal irritation. She has experienced periumbilical pain and cramping with the diarrhea. She had an appointment with Dr. Wonda Horner of [facility] recently and was started on Lialda.  She reports that her last colonoscopy was done in 2014-15, they removed some polyps and thought that she may have had Crohn's disease. The patient reports that in general she tries to stay healthy. She exercises, and eats well. With the diarrhea she has been unable to eat very much as it causes abdominal cramping and more diarrhea. She has been drinking mostly liquids and lost a significant amount of weight.  Review of Systems   In addition to the HPI above,  No Fever-chills, No Headache, No changes with Vision or hearing, No problems swallowing food or Liquids, No Chest pain, Cough or Shortness of Breath, No Blood in stool or  Urine, No dysuria, No new skin rashes or bruises, No new joints pains-aches,  No new weakness, tingling, numbness in any extremity, No recent weight gain or loss, A full 10 point Review of Systems was done, except as stated above, all other Review of Systems were negative.  Past Medical History  Past Medical History  Diagnosis Date  . Hepatitis C ~2011/2012    Incidental with physical; unclear etiology  . Fibroids   . Hypertension     Situational, diet & exercise controlled  . Hay fever   . Spider bite 01/01/15    treated x 2 months left lower leg ? possible brown recluse   . Hemorrhoids 03/29/15    some bleeding today  . PONV (postoperative nausea and vomiting)     Past Surgical History  Procedure Laterality Date  . Tubal ligation        Social History Social History  Substance Use Topics  . Smoking status: Never Smoker   . Smokeless tobacco: Never Used     Comment: NEVER USED TOBACCO  . 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 She lives at home with her daughter and is independent with her ADLs  Family History Family History  Problem Relation Age of Onset  . Alzheimer's disease Mother   . Diabetes Mother   . Glucose-6-phos deficiency Son   . Healthy Father     63 yo without medical problems  . Healthy Paternal Grandmother     60 yo without medical problems  . Liver disease Sister     ?Fatty liver disease  she knows of no IBD or intestinal issues in the family.  Prior to Admission medications   Medication Sig Start Date End Date Taking? Authorizing Provider  acetaminophen (TYLENOL) 500 MG tablet Take 500 mg by mouth every 6 (six) hours as needed for mild pain or moderate pain.   Yes Historical Provider, MD  cholecalciferol (VITAMIN D) 1000 UNITS tablet Take 1,000 Units by mouth daily.   Yes Historical Provider, MD  folic acid (FOLVITE) 1 MG tablet Take 1 tablet (1 mg total) by mouth daily. 01/26/15  Yes Eliezer Bottom, NP   Multiple Vitamins-Minerals (MULTIVITAMIN WITH MINERALS) tablet Take 1 tablet by mouth daily.   Yes Historical Provider, MD  Omega-3 Fatty Acids (OMEGA 3 PO) Take 1 tablet by mouth daily.    Yes Historical Provider, MD  vitamin B-12 (CYANOCOBALAMIN) 50 MCG tablet Take 50 mcg by mouth daily.   Yes Historical Provider, MD    Allergies  Allergen Reactions  . Lactose Diarrhea and Nausea Only  . Lisinopril Cough    Other reaction(s): Cough    Physical Exam  Vitals  Blood pressure 142/89, pulse 94, temperature 97.3 F (36.3 C), temperature source Oral, resp. rate 100, height 5\' 4"  (1.626 m), weight 45.949 kg (101 lb 4.8 oz), SpO2 100 %.   General: thin, well-developed pleasant female lying in bed in NAD  Psych:  Normal affect and insight, Not Suicidal or Homicidal, Awake Alert, Oriented X 3.  Neuro:   No F.N deficits, ALL C.Nerves Intact, Strength 5/5 all 4 extremities, Sensation intact all 4 extremities.  ENT:  Ears and Eyes appear Normal, Conjunctivae clear, PER. Moist oral mucosa without erythema or exudates.  Neck:  Supple, No lymphadenopathy appreciated  Respiratory:  Symmetrical chest wall movement, Good air movement bilaterally, CTAB.  Cardiac:  RRR, No Murmurs, no LE edema noted, no JVD.    Abdomen:  Thin, Positive bowel sounds, Soft, Non tender, Non distended,  No masses appreciated, I'm able to easily palpate her aorta.  Skin:  No Cyanosis, Normal Skin Turgor, No Skin Rash or Bruise.  LLE with healing wound.  Extremities:  Able to move all 4. 5/5 strength in each,  no effusions.  Data Review  CBC  Recent Labs Lab 04/15/15 0805  WBC 14.5*  HGB 11.9*  HCT 34.9*  PLT 792*  MCV 78.4  MCH 26.7  MCHC 34.1  RDW 13.8    Chemistries   Recent Labs Lab 04/15/15 0805  NA 128*  K 3.4*  CL 91*  CO2 24  GLUCOSE 117*  BUN 18  CREATININE 1.20*  CALCIUM 8.9  AST 22  ALT 17  ALKPHOS 231*  BILITOT 0.6    Urinalysis    Component Value Date/Time    COLORURINE YELLOW 04/15/2015 1143   APPEARANCEUR CLEAR 04/15/2015 1143   LABSPEC 1.015 04/15/2015 1143   PHURINE 6.0 04/15/2015 1143   GLUCOSEU NEGATIVE 04/15/2015 1143   HGBUR SMALL* 04/15/2015 1143   BILIRUBINUR NEGATIVE 04/15/2015 [place] 04/15/2015  1143   PROTEINUR 30* 04/15/2015 1143   UROBILINOGEN 0.2 04/15/2015 1143   NITRITE NEGATIVE 04/15/2015 1143   LEUKOCYTESUR TRACE* 04/15/2015 1143    Imaging results:   US Biopsy  03/29/2015   CLINICAL DATA:  54 year old female with a history of hepatitis C. She has been referred for image guided medical liver biopsy.  EXAM: ULTRASOUND GUIDED CORE BIOPSY OF LIVER FOR MEDICAL LIVER BIOPSY  MEDICATIONS: 2.0 mg IV Versed; 100 mcg IV Fentanyl  Total Moderate Sedation Time: 12 minutes  PROCEDURE: The procedure, risks, benefits, and alternatives were explained to the patient. Questions regarding the procedure were encouraged and answered. The patient understands and consents to the procedure.  Ultrasound survey of the right liver lobe performed with images stored and sent to PACs.  The right lower thorax/right upper abdomen was prepped with Betadine in a sterile fashion, and a sterile drape was applied covering the operative field. A sterile gown and sterile gloves were used for the procedure. Local anesthesia was provided with 1% Lidocaine.  Once the patient is prepped and draped sterilely and the skin and subcutaneous tissues were generously infiltrated with 1% lidocaine, a small stab incision was made with an 11 blade scalpel.  A 17 gauge introducer needle was advanced under ultrasound guidance in an intercostal location into the right liver lobe. The stylet was removed, and 3 separate 18 gauge core biopsy were retrieved. Samples were placed into formalin for transportation to the lab.  Three separate Gel-Foam pledgets were then infused with a small amount of saline for assistance with hemostasis.  The needle was removed, and a final  ultrasound image was performed.  The patient tolerated the procedure well and remained hemodynamically stable throughout.  No complications were encountered and no significant blood loss was encounter.  COMPLICATIONS: None.  FINDINGS: Ultrasound survey demonstrates safe approach to the right liver lobe via intercostal approach.  Images during the case demonstrate needle tip within the right liver lobe on each pass.  Final image demonstrates gas in the region of Gel-Foam pledgets, with no complicating features.  IMPRESSION: Status post ultrasound-guided medical liver biopsy, with tissue specimen sent to pathology for complete histopathologic analysis.  Signed,  Dulcy Fanny. Earleen Newport, DO  [facility]  [facility]   Electronically Signed   By: Corrie Mckusick D.O.   On: 03/29/2015 14:42    My personal review of EKG: not done in the ER.   Assessment & Plan  Principal Problem:   Diarrhea Active Problems:   Hemorrhoids   Thrombocytosis   Alpha thalassemia   Diarrhea Likely related to IBD. GI pathogen panel pending [place] gastroenterology consultation. Patient has been started on Cipro/Flagyl. CT abdomen and pelvis has been ordered. Patient should resume 4.8 g per day of Lialda starting 8/15.  Rectal bleeding Hemoglobin stable. We'll defer to gastroenterology.  Acute kidney injury Creatinine at 1.2 baseline of 0.73.  Likely prerenal due to diarrhea. Will give IV fluids and monitor creatinine.  Alpha thalassemia Being followed by hematology. Has recently received Feraheme injections July 2016.  Continue folic acid and vitamin B-12.  Thrombocytosis (792) Appears recurrent judging from [place] records. Possibly elevated at this point as an acute phase reactant. Monitor CBC.    Chronic hepatitis C Liver biopsy completed on 7/28.it showed chronic hep C that was mildly active in fibrosis in her portal system.  Hyponatremia (128) Likely due to  hypovolemia. We'll give IV fluids and monitor bmet.    Consultants Called:  [facility] [place] [facility]  Family Communication:   Patient is alert oriented and understands her plan of care  Code Status:  Full code  Condition:  stable  Potential Disposition: to home with improvement possibly 4872 hours.  Time spent in minutes : [address]  PA-C on 04/15/2015 at 3:11 PM Between 7am to 7pm - Pager - [phone] After 7pm go to [email or web address] - password [email or web address] And look for the night coverage person covering me after hours  Attending  Patient was seen, examined,treatment plan was discussed with the Physician extender. I have directly reviewed the clinical findings, lab, imaging studies and management of this patient in detail. I have made the necessary changes to the above noted documentation, and agree with the documentation, as recorded by the Physician extender.  54 year old female with a history of inflammatory bowel disease, hypertension, hepatitis C presents with a 4 week history of abdominal pain, diarrhea, and intermittent hematochezia. The patient states that she had a colonoscopy around the end of 2014 and was diagnosed with ulcerative colitis at that time. She states that she has not been on any immunomodulatory drugs. She had been taking intermittent NSAIDs on a daily basis until she saw Dr. Paulita Fujita. She states that she had been on off antibiotics for a number of weeks in May 2016 secondary to what she believed to be a spider bite. She states that her left leg has healed up nicely. Unfortunately she continues to have diarrhea. She underwent liver biopsy about one week ago which showed fibrosis. She states that there are plans to start [place]. Unfortunately, her diarrhea has gone to the point where it has been very debilitating. She is complaining of increasing generalized weakness and fatigue. She has some subjective fevers and chills, but denies any vomiting, hematemesis, chest pain,  shortness breath, dizziness, syncope. In the emergency department, the patient was noted to be in acute kidney injury with serum creatinine 1.20, sodium 128, WBC 14.5, platelets 292,000. Gastroenterology has been consulted. Dr. Michail Sermon saw the patient. CT of the abdomen and pelvis was ordered, and GI started the patient on ciprofloxacin and Flagyl. C. difficile was negative. The major concern is that of inflammatory bowel disease exacerbation. The patient's thrombocytosis is likely due to a combination of an acute phase reactant due to her acute medical illness as well as underlying iron deficiency anemia. The patient will be started on IV fluids as her hypernatremia and hypokalemia are secondary to GI losses and signify dehydration. Orson Eva, DO

## 2015-04-15 NOTE — ED Provider Notes (Signed)
CSN: [id number]     Arrival date & time 04/15/15  9735 History   First MD Initiated Contact with Patient 04/15/15 0848     Chief Complaint  Patient presents with  . Abdominal Pain  . Emesis  . Diarrhea     (Consider location/radiation/quality/duration/timing/severity/associated sxs/prior Treatment) HPI Abigail Beck is a 54 yo female with a PMH of hepatitis C who presents with diarrhea, nausea and abdominal pain. She has about 25 episodes of diarrhea each day for 2-3 weeks. Patient reports that some episodes are blood tinged and they are described as explosive. She had generalized abdominal pain with cramping x 2-3 weeks, but over the past couple of days the discomfort has moved to the left side. Patient states she has nausea and dizziness with her other symptoms, but no vomiting. Throughout this time, she has had a decreased appetite and 15 # weight loss. In May the patient had what she reports as a spider bite on her lower leg left and was treated by primary care by multiple ABX regimens. After finishing her ABX treatments, she developed these diarrhea episodes and changes to her tongue. 3 days ago she developed a lesion under her right breast that began draining yesterday. She denies fever, chills, alcohol use, or tobacco use. She has noted some blood in stool. She had a liver biopsy done 2-1/2 weeks ago.   Past Medical History  Diagnosis Date  . Hepatitis C ~2011/2012    Incidental with physical; unclear etiology  . Fibroids   . Hypertension     Situational, diet & exercise controlled  . Hay fever   . Spider bite 01/01/15    treated x 2 months left lower leg ? possible brown recluse   . Hemorrhoids 03/29/15    some bleeding today  . PONV (postoperative nausea and vomiting)    Past Surgical History  Procedure Laterality Date  . Tubal ligation     Family History  Problem Relation Age of Onset  . Alzheimer's disease Mother   . Diabetes Mother   . Glucose-6-phos deficiency Son   . Healthy  Father     42 yo without medical problems  . Healthy Paternal Grandmother     40 yo without medical problems  . Liver disease Sister     ?Fatty liver disease   Social History  Substance Use Topics  . Smoking status: Never Smoker   . Smokeless tobacco: Never Used     Comment: NEVER USED TOBACCO  . Alcohol Use: No   OB History    No data available     Review of Systems    Allergies  Lactose and Lisinopril  Home Medications   Prior to Admission medications   Medication Sig Start Date End Date Taking? Authorizing Provider  acetaminophen (TYLENOL) 500 MG tablet Take 500 mg by mouth every 6 (six) hours as needed for mild pain or moderate pain.   Yes Historical Provider, MD  cholecalciferol (VITAMIN D) 1000 UNITS tablet Take 1,000 Units by mouth daily.   Yes Historical Provider, MD  folic acid (FOLVITE) 1 MG tablet Take 1 tablet (1 mg total) by mouth daily. 01/26/15  Yes Eliezer Bottom, NP  Multiple Vitamins-Minerals (MULTIVITAMIN WITH MINERALS) tablet Take 1 tablet by mouth daily.   Yes Historical Provider, MD  Omega-3 Fatty Acids (OMEGA 3 PO) Take 1 tablet by mouth daily.    Yes Historical Provider, MD  vitamin B-12 (CYANOCOBALAMIN) 50 MCG tablet Take 50 mcg by mouth daily.  Yes Historical Provider, MD   BP 125/97 mmHg  Pulse 102  Temp(Src) 97.3 F (36.3 C) (Oral)  Resp 100  Ht 5\' 4"  (1.626 m)  Wt 101 lb 4.8 oz (45.949 kg)  BMI 17.38 kg/m2  SpO2 100% Physical Exam  Constitutional: She is oriented to person, place, and time. She appears well-developed.  Appears somewhat chronically ill  HENT:  Head: Normocephalic.  Right Ear: External ear normal.  Left Ear: External ear normal.  Mucous membranes are dry  Cardiovascular: Tachycardia present.   Pulmonary/Chest: Effort normal and breath sounds normal.  Abdominal: Soft. She exhibits no mass. There is tenderness. There is no rebound and no guarding.  Mild diffuse tenderness to palpation in lower quadrants   Musculoskeletal: Normal range of motion. She exhibits no edema.  Neurological: She is alert and oriented to person, place, and time. She has normal reflexes.  Skin:  Tenting of skin which appears dry  Psychiatric: She has a normal mood and affect. Her behavior is normal. Judgment and thought content normal.  Nursing note and vitals reviewed.   ED Course  Procedures (including critical care time) Labs Review Labs Reviewed  LIPASE, BLOOD - Abnormal; Notable for the following:    Lipase 17 (*)    All other components within normal limits  COMPREHENSIVE METABOLIC PANEL - Abnormal; Notable for the following:    Sodium 128 (*)    Potassium 3.4 (*)    Chloride 91 (*)    Glucose, Bld 117 (*)    Creatinine, Ser 1.20 (*)    Albumin 1.9 (*)    Alkaline Phosphatase 231 (*)    GFR calc non Af Amer 51 (*)    GFR calc Af Amer 59 (*)    All other components within normal limits  CBC - Abnormal; Notable for the following:    WBC 14.5 (*)    Hemoglobin 11.9 (*)    HCT 34.9 (*)    Platelets 792 (*)    All other components within normal limits  POC OCCULT BLOOD, ED - Abnormal; Notable for the following:    Fecal Occult Bld POSITIVE (*)    All other components within normal limits  C DIFFICILE QUICK SCREEN W PCR REFLEX  URINALYSIS, ROUTINE W REFLEX MICROSCOPIC (NOT AT [facility])    Imaging Review No results found. I, Tysheena Ginzburg S, personally reviewed and evaluated these images and lab results as part of my medical decision-making.   EKG Interpretation None      MDM   Final diagnoses:  Diarrhea  Volume depletion  Hyponatremia   54 year old female recently on antibiotics with copious diarrhea that is Hemoccult-positive C. difficile testing is pending. Patient is having IV rehydration here. Heart rate has decreased from 120 down to about 100. She is hyponatremic and appears to be hemoconcentrated with platelets of 792,000. White blood cell count is elevated at 14,500. Plan admission  for ongoing hydration and assessment of her diarrhea. Patient is seen by gastroenterology, Dr. Paulita Fujita. Patient's care discussed with Dr. Michail Sermon and he will consult on patient as inpatient. Discussed with Agustina Caroli and patient to be admitted to [place] bed.  Pattricia Boss, MD 04/15/15 1435

## 2015-04-16 DIAGNOSIS — E43 Unspecified severe protein-calorie malnutrition: Secondary | ICD-10-CM | POA: Insufficient documentation

## 2015-04-16 DIAGNOSIS — K6389 Other specified diseases of intestine: Secondary | ICD-10-CM

## 2015-04-16 NOTE — Progress Notes (Signed)
Initial Nutrition Assessment  DOCUMENTATION CODES:   Severe malnutrition in context of acute illness/injury  INTERVENTION:   -Continue Boost Breeze po TID, each supplement provides 250 kcal and 9 grams of protein -Continue MVI daily  NUTRITION DIAGNOSIS:   Malnutrition related to acute illness as evidenced by moderate depletions of muscle mass, moderate depletion of body fat, percent weight loss.  GOAL:   Patient will meet greater than or equal to 90% of their needs  MONITOR:   PO intake, Supplement acceptance, Diet advancement, Labs, Weight trends, Skin, I & O's  REASON FOR ASSESSMENT:   Malnutrition Screening Tool, Consult Assessment of nutrition requirement/status  ASSESSMENT:   Abigail Beck is a 54 y.o. female, with all for thalassemia, idiopathic hep C, possible Crohn's colitis and hemorrhoids. Who presents to the emergency department today with diarrhea. The patient reports that she had a spider bite in May and has gone through several rounds of antibiotics. For the past 4 weeks she has had diarrhea. She describes her bowel movements as tiny squirts of liquid stool, and states that she feels as though she has incomplete evacuation. She has the sensation of tenesmus. She also complains of bleeding but believes that is due to hemorrhoidal irritation. She has experienced periumbilical pain and cramping with the diarrhea. She had an appointment with Dr. Wonda Horner of [facility] recently and was started on Lialda. She reports that her last colonoscopy was done in 2014-15, they removed some polyps and thought that she may have had Crohn's disease. The patient reports that in general she tries to stay healthy. She exercises, and eats well. With the diarrhea she has been unable to eat very much as it causes abdominal cramping and more diarrhea. She has been drinking mostly liquids and lost a significant amount of weight.  Pt admitted with diarrhea related to IBD.  Spoke  with pt at bedside. She reports a general decline in health over the past 1 month. She reports she tries to eat, but "everything runs through me". She is currently on a clear liquid diet; she reports the broth gave her diarrhea (reveals 5+ bowel movements today).   Pt reveals UBW of 125#. Prior to a month ago, she was in her usual state of health and had a good appetite. She estimates a 20# wt loss over the past month (wt hx reveals a 12.2% wt loss x 1 month, which is significant).   Nutrition-Focused physical exam completed. Findings are mild to moderate fat depletion, mild to moderate muscle depletion, and no edema. Pt describes her skin as "really saggy".   Pt had an unopened Boost Breeze container at bedside; she reports concern about drinking it due to lactose intolerance. This RD reviewed ingredients and confirmed no lactose (milk product is whey protein isolate). Pt agreeable to drink.  Discussed importance of good PO intake to promote healing.   GI following. Awaiting GI panel.   Diet Order:  Diet clear liquid Room service appropriate?: Yes; Fluid consistency:: Thin  Skin:  Wound (see comment) (MASD on buttocks)  Last BM:  04/15/15  Height:   Ht Readings from Last 1 Encounters:  04/15/15 5\' 4"  (1.626 m)    Weight:   Wt Readings from Last 1 Encounters:  04/15/15 101 lb 4.8 oz (45.949 kg)    Ideal Body Weight:  54.5 kg  BMI:  Body mass index is 17.38 kg/(m^2).  Estimated Nutritional Needs:   Kcal:  1400-1600  Protein:  55-70 grams  Fluid:  1.4-1.6  L  EDUCATION NEEDS:   Education needs addressed  Abigail Beck A. Jimmye Norman, RD, LDN, CDE Pager: [phone] After hours Pager: [phone]

## 2015-04-16 NOTE — Care Management Note (Signed)
Case Management Note  Patient Details  Name: Abigail Beck MRN: [id number] Date of Birth: [birth year]  Subjective/Objective:    Pt admitted on 04/15/15 with diarrhea, abdominal pain.  PTA, pt independent of ADLS.                Action/Plan: Will follow for discharge planning as pt progresses.    Expected Discharge Date:                  Expected Discharge Plan:   Home/Self care  In-House Referral:     Discharge planning Services   CM referral  Post Acute Care Choice:    Choice offered to:     DME Arranged:    DME Agency:     HH Arranged:    [place] Agency:     Status of Service:    in process, will continue to follow Medicare Important Message Given:    Date Medicare IM Given:    Medicare IM give by:    Date Additional Medicare IM Given:    Additional Medicare Important Message give by:     If discussed at [place] of Stay Meetings, dates discussed:    Additional Comments:  Reinaldo Raddle, RN, BSN  Trauma/Neuro ICU Case Manager [phone]

## 2015-04-16 NOTE — Progress Notes (Signed)
[facility] PROGRESS NOTE  Abigail Beck JIR:[id number] DOB: [birth year] DOA: 04/15/2015 PCP: Shellia Carwin, PA-C  Assessment/Plan: Diarrhea -Likely related to IBD. GI pathogen panel pending; but C. difficile negative -Appreciate [facility] gastroenterology consultation. -Patient has been started on Cipro/Flagyl empirically -CT abdomen and pelvis has been ordered by GI. -will continue Lialda as recommended by GI  -?? Need of steroids   Rectal bleeding -Hemoglobin stable. Will defer to gastroenterology final decisions on need of colonoscopy or any further workup. -Will follow hemoglobin trend and transfuse as needed  Acute kidney injury -Creatinine at 1.2 baseline of 0.73. Likely prerenal due to diarrhea and poor intake. -Will give IV fluids and monitor creatinine trend.  Alpha thalassemia -Being followed by hematology. Has recently received Feraheme injections July 2016. Continue folic acid and vitamin B-12.  Thrombocytosis (792) -Appears recurrent judging from [place] records. Possibly elevated at this point as an acute phase reactant. Monitor CBC. -If persistent will benefit of further work up as an outpatient by outpatient hematology service (patient currently follow by them)    Chronic hepatitis C -Liver biopsy completed on 7/28; it showed chronic hep C that was mildly active and fibrosis in her portal system. -Patient is actively follow by GI  Hyponatremia (128) -Likely due to hypovolemia and diarrhea - Will give IV fluids and monitor bmet.  Protein calorie malnutrition (severe) We'll continue feeding supplements as recommended by nutritional service  Code Status: Full Family Communication: No family at bedside Disposition Plan: To be determined, remains inpatient   Consultants:  Gastroenterology  Procedures:  See below for x-ray report  Antibiotics:  Ciprofloxacin and Flagyl 04/14/14  HPI/Subjective: Afebrile, denies chest pain or shortness of  breath. She reports some decrease in her diarrhea episodes and no further vomiting. Still nauseated  Objective: Filed Vitals:   04/16/15 1338  BP: 128/79  Pulse: 92  Temp: 98.4 F (36.9 C)  Resp: 16    Intake/Output Summary (Last 24 hours) at 04/16/15 1647 Last data filed at 04/16/15 1502  Gross per 24 hour  Intake 2173.33 ml  Output      0 ml  Net 2173.33 ml   Filed Weights   04/15/15 0759  Weight: 45.949 kg (101 lb 4.8 oz)    Exam:   General:  Severely underweight, chronically ill appearing, frail. Afebrile, reports still feeling nausea but no further vomiting. Some decrease in her diarrhea.  Cardiovascular: S1 and S2, no rubs, no gallops, no JVD  Respiratory: Clear to auscultation bilaterally  Abdomen: Soft, positive bowel sounds, mild guarding diffusely due to tenderness.  Musculoskeletal: No edema, no cyanosis, no clubbing.   Data Reviewed: Basic Metabolic Panel:  Recent Labs Lab 04/15/15 0805  NA 128*  K 3.4*  CL 91*  CO2 24  GLUCOSE 117*  BUN 18  CREATININE 1.20*  CALCIUM 8.9   Liver Function Tests:  Recent Labs Lab 04/15/15 0805  AST 22  ALT 17  ALKPHOS 231*  BILITOT 0.6  PROT 7.3  ALBUMIN 1.9*    Recent Labs Lab 04/15/15 0805  LIPASE 17*   CBC:  Recent Labs Lab 04/15/15 0805  WBC 14.5*  HGB 11.9*  HCT 34.9*  MCV 78.4  PLT 792*   CBG: No results for input(s): GLUCAP in the last 168 hours.  Recent Results (from the past 240 hour(s))  Clostridium Difficile by PCR     Status: None   Collection Time: 04/15/15 10:00 AM  Result Value Ref Range Status   Toxigenic C Difficile by pcr  NEGATIVE NEGATIVE Final     Studies: Ct Abdomen Pelvis W Contrast  04/15/2015   CLINICAL DATA:  Abdominal cramping, nausea, watery diarrhea x2 weeks, recent antibiotics  EXAM: CT ABDOMEN AND PELVIS WITH CONTRAST  TECHNIQUE: Multidetector CT imaging of the abdomen and pelvis was performed using the standard protocol following bolus administration  of intravenous contrast.  CONTRAST:  79mL OMNIPAQUE IOHEXOL 300 MG/ML  SOLN  COMPARISON:  None.  FINDINGS: Lower chest:  Lung bases are clear.  Hepatobiliary: 8 x 4 mm hypoenhancing lesion anterior segment right hepatic lobe (series 2/ image 26), likely reflecting the site of prior liver biopsy. Liver is otherwise within normal limits. No suspicious/enhance hepatic lesions.  Gallbladder is unremarkable. No intrahepatic or extrahepatic ductal dilatation dictation.  Pancreas: Within normal limits.  Spleen: Within normal limits.  Adrenals/Urinary Tract: Adrenal glands are within normal limits.  8 mm calcified lesion along the right upper pole (series 2/image 22). Mild renal cortical scarring (series 2/image 28). Left kidney is within normal limits. No hydronephrosis.  Bladder is within normal limits.  Stomach/Bowel: Stomach is within normal limits.  No evidence of bowel obstruction.  Normal appendix.  Diffuse wall thickening involving the colon, suspicious for infectious/inflammatory pancolitis.  Vascular/Lymphatic: No evidence of abdominal aortic aneurysm.  No suspicious abdominopelvic lymphadenopathy.  Reproductive: Multiple uterine fibroids, including a 2.2 cm submucosal fibroid in the anterior uterine body (series 2/image 68).  Bilateral ovaries are within normal limits.  Other: No abdominopelvic ascites.  No drainable fluid collection/abscess.  No free air.  Musculoskeletal: Visualized osseous structures are within normal limits.  IMPRESSION: Findings suggestive of infectious/inflammatory pancolitis, possibly pseudomembranous colitis given the clinical history of recent antibiotic usage.  No drainable fluid collection/abscess.  No free air.   Electronically Signed   By: Julian Hy M.D.   On: 04/15/2015 18:33    Scheduled Meds: . ciprofloxacin  400 mg Intravenous 3 times per day  . feeding supplement  1 Container Oral TID BM  . folic acid  1 mg Oral Daily  . mesalamine  4.8 g Oral Q breakfast  .  metronidazole  500 mg Intravenous Q8H  . multivitamin with minerals  1 tablet Oral Daily  . vitamin B-12  50 mcg Oral Daily   Continuous Infusions: . 0.9 % NaCl with KCl 40 mEq / L 100 mL/hr (04/16/15 0757)    Principal Problem:   Diarrhea Active Problems:   Hemorrhoids   Thrombocytosis   Alpha thalassemia   Chronic hepatitis C   Abdominal pain, generalized   Hyponatremia   Protein-calorie malnutrition, severe    Time spent: 30 minutes    Barton Dubois  [facility] Pager [phone] . If 7PM-7AM, please contact night-coverage at [email or web address], password [facility] 04/16/2015, 4:47 PM  LOS: 1 day

## 2015-04-16 NOTE — Progress Notes (Signed)
Patient ID: Abigail Beck, female   DOB: 04/06/1961, 54 y.o.   MRN: [id number] [facility] Gastroenterology Progress Note  Abigail Beck 54 y.o. Jul 22, 1961   Subjective: Diarrhea continues to occur but denies blood. Abdominal pain persisting. Denies N/V. Reports diarrhea this morning after eating broth.  Objective: Vital signs in last 24 hours: Filed Vitals:   04/15/15 2143  BP: 128/79  Pulse: 94  Temp: 98.2 F (36.8 C)  Resp: 16    Physical Exam: Gen: alert, no acute distress, thin HEENT: anicteric CV: RRR Chest: CTA B Abd: periumbilical and left-sided tenderness with guarding, soft, nondistended, +BS Ext: pedal pulses intact, no edema  Lab Results:  Recent Labs  04/15/15 0805  NA 128*  K 3.4*  CL 91*  CO2 24  GLUCOSE 117*  BUN 18  CREATININE 1.20*  CALCIUM 8.9    Recent Labs  04/15/15 0805  AST 22  ALT 17  ALKPHOS 231*  BILITOT 0.6  PROT 7.3  ALBUMIN 1.9*    Recent Labs  04/15/15 0805  WBC 14.5*  HGB 11.9*  HCT 34.9*  MCV 78.4  PLT 792*   No results for input(s): LABPROT, INR in the last 72 hours.    Assessment/Plan: Bloody diarrhea question infection vs inflammatory bowel disease or both. C. Diff negative. GI pathogen panel pending. Continue IV Cipro/Flagyl. Continue IVFs. Keep on clear liquids. Hold off on IV steroids. Supportive care.    [place] C. 04/16/2015, 10:29 AM  Pager [phone]  If no answer or after 5 PM call [phone]

## 2015-04-17 DIAGNOSIS — D560 Alpha thalassemia: Secondary | ICD-10-CM

## 2015-04-17 LAB — BASIC METABOLIC PANEL
ANION GAP: 6 (ref 5–15)
BUN: 7 mg/dL (ref 6–20)
CALCIUM: 8.6 mg/dL — AB (ref 8.9–10.3)
CO2: 22 mmol/L (ref 22–32)
Chloride: 104 mmol/L (ref 101–111)
Creatinine, Ser: 0.85 mg/dL (ref 0.44–1.00)
GFR calc Af Amer: >60 mL/min (ref >60)
GLUCOSE: 95 mg/dL (ref 65–99)
Potassium: 4.2 mmol/L (ref 3.5–5.1)
Sodium: 132 mmol/L — ABNORMAL LOW (ref 135–145)

## 2015-04-17 LAB — FECAL LACTOFERRIN, QUANT: FECAL LACTOFERRIN: POSITIVE

## 2015-04-17 MED ORDER — CIPROFLOXACIN IN D5W 400 MG/200ML IV SOLN
400.0000 mg | Freq: Two times a day (BID) | INTRAVENOUS | Status: DC
Start: 2015-04-17 — End: 2015-04-18
  Administered 2015-04-17 – 2015-04-18 (×2): 400 mg via INTRAVENOUS
  Filled 2015-04-17 (×3): qty 200

## 2015-04-17 MED ORDER — SACCHAROMYCES BOULARDII 250 MG PO CAPS
250.0000 mg | ORAL_CAPSULE | Freq: Two times a day (BID) | ORAL | Status: DC
  Administered 2015-04-17 – 2015-04-19 (×5): 250 mg via ORAL
  Filled 2015-04-17 (×5): qty 1

## 2015-04-17 NOTE — Progress Notes (Signed)
Patient ID: Abigail Beck, female   DOB: 1960/10/29, 54 y.o.   MRN: [id number] [facility] Gastroenterology Progress Note  Abigail Beck 54 y.o. 1961/07/15   Subjective: Diarrhea has decreased some today. Feels a lot better then she did at admit. Abdominal pain has improved. No blood seen in stool. Tolerating liquid diet. Wants to eat.  Objective: Vital signs in last 24 hours: Filed Vitals:   04/17/15 0542  BP: 117/77  Pulse: 60  Temp: 97.9 F (36.6 C)  Resp: 18    Physical Exam: Gen: alert, no acute distress, thin HEENT: anicteric CV: RRR Chest: CTA B Abd: soft, minimal left-sided tenderness, nondistended, +BS  Lab Results:  Recent Labs  04/15/15 0805 04/17/15 0305  NA 128* 132*  K 3.4* 4.2  CL 91* 104  CO2 24 22  GLUCOSE 117* 95  BUN 18 7  CREATININE 1.20* 0.85  CALCIUM 8.9 8.6*    Recent Labs  04/15/15 0805  AST 22  ALT 17  ALKPHOS 231*  BILITOT 0.6  PROT 7.3  ALBUMIN 1.9*    Recent Labs  04/15/15 0805  WBC 14.5*  HGB 11.9*  HCT 34.9*  MCV 78.4  PLT 792*   No results for input(s): LABPROT, INR in the last 72 hours.    Assessment/Plan: Bloody diarrhea likely infectious in setting of Crohn's disease. Continue IV Abx. IVFs. Supportive care. Change to soft diet. GI pathogen panel pending. Home in 1-2 days if tolerating diet and diarrhea starts to resolve.   [place] C. 04/17/2015, 12:41 PM  Pager [phone]  If no answer or after 5 PM call [phone]

## 2015-04-17 NOTE — Progress Notes (Signed)
[facility] PROGRESS NOTE  Abigail Beck JSE:[id number] DOB: 04/04/1961 DOA: 04/15/2015 PCP: Shellia Carwin, PA-C  Assessment/Plan: Diarrhea -Likely related to IBD. GI pathogen panel pending; but C. difficile negative -Appreciate [facility] consultation. -Patient will continue Cipro/Flagyl empirically -CT abdomen and pelvis suggesting infectious vs inflammatory pancolitis. No abscess or fluid collection -will continue Lialda as recommended by GI  -no need of steroids for now, as per GI rec's  -will add florastor to help with diarrhea -patient's diet would be advance to soft diet.  Rectal bleeding -Hemoglobin stable. Will defer to gastroenterology final decisions on need of colonoscopy or any further workup. -Will follow hemoglobin trend and transfuse as needed  Acute kidney injury -Creatinine back to normal after IVF's resuscitation -Likely prerenal due to diarrhea and poor intake. -Will follow trend  Alpha thalassemia -Being followed by hematology. Has recently received Feraheme injections July 2016. Continue folic acid and vitamin B-12.  Thrombocytosis (792) -Appears recurrent judging from [place] records. Possibly elevated at this point as an acute phase reactant. Monitor CBC. -If persistent will benefit of further work up as an outpatient by outpatient hematology service (patient currently follow by them)    Chronic hepatitis C -Liver biopsy completed on 7/28; it showed chronic hep C that was mildly active and fibrosis in her portal system. -Patient is actively follow by GI  Hyponatremia (128 on admission) -Likely due to hypovolemia and diarrhea - Will continue IV fluids and monitor bmet. -Na 132 on 8/16  Protein calorie malnutrition (severe) Will continue feeding supplements as recommended by nutritional service  Code Status: Full Family Communication: No family at bedside Disposition Plan: To be determined, remains  inpatient   Consultants:  Gastroenterology  Procedures:  See below for x-ray report  Antibiotics:  Ciprofloxacin and Flagyl 04/14/14  HPI/Subjective: Afebrile, denies chest pain or shortness of breath. She reports diarrhea continue decrease and she is not having vomiting. Would like to have diet advance. No further blood seen in her stools  Objective: Filed Vitals:   04/17/15 0542  BP: 117/77  Pulse: 60  Temp: 97.9 F (36.6 C)  Resp: 18    Intake/Output Summary (Last 24 hours) at 04/17/15 1447 Last data filed at 04/16/15 1848  Gross per 24 hour  Intake    720 ml  Output      0 ml  Net    720 ml   Filed Weights   04/15/15 0759  Weight: 45.949 kg (101 lb 4.8 oz)    Exam:   General:  Severely underweight, chronically ill appearing and frail. Afebrile, reports feeling better today, diarrhea episodes are less frequent and she has not have any further vomiting.  Cardiovascular: S1 and S2, no rubs, no gallops, no JVD  Respiratory: Clear to auscultation bilaterally  Abdomen: Soft, positive bowel sounds, mild guarding diffusely due to tenderness.  Musculoskeletal: No edema, no cyanosis, no clubbing.   Data Reviewed: Basic Metabolic Panel:  Recent Labs Lab 04/15/15 0805 04/17/15 0305  NA 128* 132*  K 3.4* 4.2  CL 91* 104  CO2 24 22  GLUCOSE 117* 95  BUN 18 7  CREATININE 1.20* 0.85  CALCIUM 8.9 8.6*   Liver Function Tests:  Recent Labs Lab 04/15/15 0805  AST 22  ALT 17  ALKPHOS 231*  BILITOT 0.6  PROT 7.3  ALBUMIN 1.9*    Recent Labs Lab 04/15/15 0805  LIPASE 17*   CBC:  Recent Labs Lab 04/15/15 0805  WBC 14.5*  HGB 11.9*  HCT 34.9*  MCV 78.4  PLT 792*    Recent Results (from the past 240 hour(s))  Clostridium Difficile by PCR     Status: None   Collection Time: 04/15/15 10:00 AM  Result Value Ref Range Status   Toxigenic C Difficile by pcr NEGATIVE NEGATIVE Final     Studies: Ct Abdomen Pelvis W Contrast  04/15/2015    CLINICAL DATA:  Abdominal cramping, nausea, watery diarrhea x2 weeks, recent antibiotics  EXAM: CT ABDOMEN AND PELVIS WITH CONTRAST  TECHNIQUE: Multidetector CT imaging of the abdomen and pelvis was performed using the standard protocol following bolus administration of intravenous contrast.  CONTRAST:  63mL OMNIPAQUE IOHEXOL 300 MG/ML  SOLN  COMPARISON:  None.  FINDINGS: Lower chest:  Lung bases are clear.  Hepatobiliary: 8 x 4 mm hypoenhancing lesion anterior segment right hepatic lobe (series 2/ image 26), likely reflecting the site of prior liver biopsy. Liver is otherwise within normal limits. No suspicious/enhance hepatic lesions.  Gallbladder is unremarkable. No intrahepatic or extrahepatic ductal dilatation dictation.  Pancreas: Within normal limits.  Spleen: Within normal limits.  Adrenals/Urinary Tract: Adrenal glands are within normal limits.  8 mm calcified lesion along the right upper pole (series 2/image 22). Mild renal cortical scarring (series 2/image 28). Left kidney is within normal limits. No hydronephrosis.  Bladder is within normal limits.  Stomach/Bowel: Stomach is within normal limits.  No evidence of bowel obstruction.  Normal appendix.  Diffuse wall thickening involving the colon, suspicious for infectious/inflammatory pancolitis.  Vascular/Lymphatic: No evidence of abdominal aortic aneurysm.  No suspicious abdominopelvic lymphadenopathy.  Reproductive: Multiple uterine fibroids, including a 2.2 cm submucosal fibroid in the anterior uterine body (series 2/image 68).  Bilateral ovaries are within normal limits.  Other: No abdominopelvic ascites.  No drainable fluid collection/abscess.  No free air.  Musculoskeletal: Visualized osseous structures are within normal limits.  IMPRESSION: Findings suggestive of infectious/inflammatory pancolitis, possibly pseudomembranous colitis given the clinical history of recent antibiotic usage.  No drainable fluid collection/abscess.  No free air.    Electronically Signed   By: Julian Hy M.D.   On: 04/15/2015 18:33    Scheduled Meds: . ciprofloxacin  400 mg Intravenous Q12H  . feeding supplement  1 Container Oral TID BM  . folic acid  1 mg Oral Daily  . mesalamine  4.8 g Oral Q breakfast  . metronidazole  500 mg Intravenous Q8H  . multivitamin with minerals  1 tablet Oral Daily  . saccharomyces boulardii  250 mg Oral BID  . vitamin B-12  50 mcg Oral Daily   Continuous Infusions: . 0.9 % NaCl with KCl 40 mEq / L 100 mL/hr (04/17/15 1045)    Principal Problem:   Diarrhea Active Problems:   Hemorrhoids   Thrombocytosis   Alpha thalassemia   Chronic hepatitis C   Abdominal pain, generalized   Hyponatremia   Protein-calorie malnutrition, severe    Time spent: 30 minutes    Barton Dubois  [facility] Pager [phone] . If 7PM-7AM, please contact night-coverage at [email or web address], password [facility] 04/17/2015, 2:47 PM  LOS: 2 days

## 2015-04-18 DIAGNOSIS — R197 Diarrhea, unspecified: Secondary | ICD-10-CM

## 2015-04-18 LAB — BASIC METABOLIC PANEL
Anion gap: 5 (ref 5–15)
BUN: 6 mg/dL (ref 6–20)
CALCIUM: 8.1 mg/dL — AB (ref 8.9–10.3)
CHLORIDE: 110 mmol/L (ref 101–111)
CO2: 18 mmol/L — AB (ref 22–32)
CREATININE: 0.77 mg/dL (ref 0.44–1.00)
GFR calc Af Amer: >60 mL/min (ref >60)
GFR calc non Af Amer: >60 mL/min (ref >60)
Glucose, Bld: 116 mg/dL — ABNORMAL HIGH (ref 65–99)
Potassium: 4.5 mmol/L (ref 3.5–5.1)
SODIUM: 133 mmol/L — AB (ref 135–145)

## 2015-04-18 LAB — GI PATHOGEN PANEL BY PCR, STOOL
C difficile toxin A/B: NOT DETECTED
CRYPTOSPORIDIUM BY PCR: NOT DETECTED
Campylobacter by PCR: NOT DETECTED
E COLI (STEC): NOT DETECTED
E coli (ETEC) LT/ST: NOT DETECTED
E coli 0157 by PCR: NOT DETECTED
G LAMBLIA BY PCR: NOT DETECTED
NOROVIRUS G1/G2: NOT DETECTED
ROTAVIRUS A BY PCR: NOT DETECTED
SHIGELLA BY PCR: NOT DETECTED
Salmonella by PCR: NOT DETECTED

## 2015-04-18 LAB — CBC
HEMATOCRIT: 27.5 % — AB (ref 36.0–46.0)
HEMOGLOBIN: 9.3 g/dL — AB (ref 12.0–15.0)
MCH: 27 pg (ref 26.0–34.0)
MCHC: 33.8 g/dL (ref 30.0–36.0)
MCV: 79.9 fL (ref 78.0–100.0)
Platelets: 449 10*3/uL — ABNORMAL HIGH (ref 150–400)
RBC: 3.44 MIL/uL — ABNORMAL LOW (ref 3.87–5.11)
RDW: 14.4 % (ref 11.5–15.5)
WBC: 10.9 10*3/uL — ABNORMAL HIGH (ref 4.0–10.5)

## 2015-04-18 MED ORDER — METRONIDAZOLE 500 MG PO TABS
500.0000 mg | ORAL_TABLET | Freq: Three times a day (TID) | ORAL | Status: DC
  Administered 2015-04-18 – 2015-04-19 (×4): 500 mg via ORAL
  Filled 2015-04-18 (×4): qty 1

## 2015-04-18 MED ORDER — CIPROFLOXACIN HCL 500 MG PO TABS
500.0000 mg | ORAL_TABLET | Freq: Two times a day (BID) | ORAL | Status: DC
  Administered 2015-04-18 – 2015-04-19 (×3): 500 mg via ORAL
  Filled 2015-04-18 (×3): qty 1

## 2015-04-18 NOTE — Progress Notes (Signed)
[facility] PROGRESS NOTE  Caidynce Muzyka CBU:[id number] DOB: [birth year] DOA: 04/15/2015 PCP: Shellia Carwin, PA-C  Assessment/Plan: Diarrhea -Likely related to IBD. GI pathogen panel pending; but C. difficile negative -Appreciate [facility] consultation. -Patient will continue Cipro/Flagyl empirically, change to po antibiotics.  -CT abdomen and pelvis suggesting infectious vs inflammatory pancolitis. No abscess or fluid collection -will continue Lialda as recommended by GI  -no need of steroids for now, as per GI rec's  -will add florastor to help with diarrhea -patient's diet would be advance to soft diet.  Rectal bleeding -Hemoglobin stable.  -Will follow hemoglobin trend and transfuse as needed  Acute kidney injury -Creatinine back to normal after IVF's resuscitation -Likely prerenal due to diarrhea and poor intake. -Will follow trend  Alpha thalassemia -Being followed by hematology. Has recently received Feraheme injections July 2016. Continue folic acid and vitamin B-12.  Thrombocytosis (792) -Appears recurrent judging from [place] records. Possibly elevated at this point as an acute phase reactant. Monitor CBC. -If persistent will benefit of further work up as an outpatient by outpatient hematology service (patient currently follow by them)    Chronic hepatitis C -Liver biopsy completed on 7/28; it showed chronic hep C that was mildly active and fibrosis in her portal system. -Patient is actively follow by GI  Hyponatremia (128 on admission) -Likely due to hypovolemia and diarrhea - Will continue IV fluids and monitor bmet.   Protein calorie malnutrition (severe) Will continue feeding supplements as recommended by nutritional service  Code Status: Full Family Communication:  family at bedside Disposition Plan: discharge in am   Consultants:  Gastroenterology  Procedures:  See below for x-ray report  Antibiotics:  Ciprofloxacin  and Flagyl 04/14/14  HPI/Subjective: Diarrhea improved.  No further blood seen in her stools  Objective: Filed Vitals:   04/18/15 1527  BP: 118/70  Pulse: 85  Temp: 97.9 F (36.6 C)  Resp: 16    Intake/Output Summary (Last 24 hours) at 04/18/15 1836 Last data filed at 04/18/15 1528  Gross per 24 hour  Intake    969 ml  Output      0 ml  Net    969 ml   Filed Weights   04/15/15 0759 04/18/15 0640  Weight: 45.949 kg (101 lb 4.8 oz) 46.6 kg (102 lb 11.8 oz)    Exam:   General:  Severely underweight, chronically ill appearing and frail. Afebrile, reports feeling better today, diarrhea episodes are less frequent and she has not have any further vomiting.  Cardiovascular: S1 and S2, no rubs, no gallops, no JVD  Respiratory: Clear to auscultation bilaterally  Abdomen: Soft, positive bowel sounds, mild guarding diffusely due to tenderness.  Musculoskeletal: No edema, no cyanosis, no clubbing.   Data Reviewed: Basic Metabolic Panel:  Recent Labs Lab 04/15/15 0805 04/17/15 0305 04/18/15 0526  NA 128* 132* 133*  K 3.4* 4.2 4.5  CL 91* 104 110  CO2 24 22 18*  GLUCOSE 117* 95 116*  BUN 18 7 6   CREATININE 1.20* 0.85 0.77  CALCIUM 8.9 8.6* 8.1*   Liver Function Tests:  Recent Labs Lab 04/15/15 0805  AST 22  ALT 17  ALKPHOS 231*  BILITOT 0.6  PROT 7.3  ALBUMIN 1.9*    Recent Labs Lab 04/15/15 0805  LIPASE 17*   CBC:  Recent Labs Lab 04/15/15 0805 04/18/15 0526  WBC 14.5* 10.9*  HGB 11.9* 9.3*  HCT 34.9* 27.5*  MCV 78.4 79.9  PLT 792* 449*    Recent Results (  from the past 240 hour(s))  Clostridium Difficile by PCR     Status: None   Collection Time: 04/15/15 10:00 AM  Result Value Ref Range Status   Toxigenic C Difficile by pcr NEGATIVE NEGATIVE Final     Studies: No results found.  Scheduled Meds: . ciprofloxacin  500 mg Oral BID  . feeding supplement  1 Container Oral TID BM  . folic acid  1 mg Oral Daily  . mesalamine  4.8 g Oral  Q breakfast  . metroNIDAZOLE  500 mg Oral 3 times per day  . multivitamin with minerals  1 tablet Oral Daily  . saccharomyces boulardii  250 mg Oral BID  . vitamin B-12  50 mcg Oral Daily   Continuous Infusions: . 0.9 % NaCl with KCl 40 mEq / L 100 mL/hr (04/18/15 1551)    Principal Problem:   Diarrhea Active Problems:   Hemorrhoids   Thrombocytosis   Alpha thalassemia   Chronic hepatitis C   Abdominal pain, generalized   Hyponatremia   Protein-calorie malnutrition, severe    Time spent: 30 minutes    [facility] Pager [phone] . If 7PM-7AM, please contact night-coverage at [email or web address], password [facility] 04/18/2015, 6:36 PM  LOS: 3 days

## 2015-04-18 NOTE — Progress Notes (Signed)
Patient ID: Abigail Beck, female   DOB: [birth year], 54 y.o.   MRN: [id number] [facility] Progress Note  Abigail Beck 54 y.o. 54   Subjective: Feels better. Reports that BMs are getting form to them now. Denies N/V. Denies rectal bleeding. Daughter at bedside.  Objective: Vital signs in last 24 hours: Filed Vitals:   04/18/15 0640  BP: 110/80  Pulse: 77  Temp: 98.2 F (36.8 C)  Resp: 17    Physical Exam: Gen: alert, no acute distress CV: RRR Chest: CTA B Abd: minimal periumbilical and left-sided tenderness without guarding, soft, nondistended, +BS Ext: no edema  Lab Results:  Recent Labs  04/17/15 0305 04/18/15 0526  NA 132* 133*  K 4.2 4.5  CL 104 110  CO2 22 18*  GLUCOSE 95 116*  BUN 7 6  CREATININE 0.85 0.77  CALCIUM 8.6* 8.1*   No results for input(s): AST, ALT, ALKPHOS, BILITOT, PROT, ALBUMIN in the last 72 hours.  Recent Labs  04/18/15 0526  WBC 10.9*  HGB 9.3*  HCT 27.5*  MCV 79.9  PLT 449*   No results for input(s): LABPROT, INR in the last 72 hours.    Assessment/Plan: Resolving infectious colitis in setting of Crohn's disease. GI pathogen panel pending. Continue Lialda. Change to PO Flagyl/Cipro today. If stable ok to go home tomorrow.   [place] C. 04/18/2015, 11:35 AM  Pager [phone]  If no answer or after 5 PM call [phone]

## 2015-04-19 LAB — BASIC METABOLIC PANEL
ANION GAP: 6 (ref 5–15)
BUN: <5 mg/dL — ABNORMAL LOW (ref 6–20)
CHLORIDE: 113 mmol/L — AB (ref 101–111)
CO2: 17 mmol/L — AB (ref 22–32)
CREATININE: 0.75 mg/dL (ref 0.44–1.00)
Calcium: 8.6 mg/dL — ABNORMAL LOW (ref 8.9–10.3)
GFR calc non Af Amer: >60 mL/min (ref >60)
Glucose, Bld: 82 mg/dL (ref 65–99)
Potassium: 4.6 mmol/L (ref 3.5–5.1)
SODIUM: 136 mmol/L (ref 135–145)

## 2015-04-19 MED ORDER — ONDANSETRON HCL 4 MG PO TABS: 4.0000 mg | ORAL_TABLET | Freq: Four times a day (QID) | ORAL | Status: DC | PRN

## 2015-04-19 MED ORDER — MESALAMINE 1.2 G PO TBEC: 4.8000 g | DELAYED_RELEASE_TABLET | Freq: Every day | ORAL | Status: DC

## 2015-04-19 MED ORDER — METRONIDAZOLE 500 MG PO TABS: 500.0000 mg | ORAL_TABLET | Freq: Three times a day (TID) | ORAL | Status: DC

## 2015-04-19 MED ORDER — SACCHAROMYCES BOULARDII 250 MG PO CAPS: 250.0000 mg | ORAL_CAPSULE | Freq: Two times a day (BID) | ORAL | Status: DC

## 2015-04-19 MED ORDER — CIPROFLOXACIN HCL 500 MG PO TABS: 500.0000 mg | ORAL_TABLET | Freq: Two times a day (BID) | ORAL | Status: DC

## 2015-04-19 NOTE — Progress Notes (Signed)
Patient ID: Abigail Beck, female   DOB: 1961/01/06, 54 y.o.   MRN: [id number] [facility] Progress Note  Abigail Beck 54 y.o. 1961-08-17   Subjective: Feels good. Wants to go home. Tolerating diet.  Objective: Vital signs in last 24 hours: Filed Vitals:   04/19/15 1324  BP: 114/74  Pulse: 78  Temp: 97.9 F (36.6 C)  Resp: 19    Physical Exam: Gen: alert, no acute distress CV: RRR Chest: CTA B Abd: soft, nontender, nondistended, +BS  Lab Results:  Recent Labs  04/17/15 0305 04/18/15 0526  NA 132* 133*  K 4.2 4.5  CL 104 110  CO2 22 18*  GLUCOSE 95 116*  BUN 7 6  CREATININE 0.85 0.77  CALCIUM 8.6* 8.1*   No results for input(s): AST, ALT, ALKPHOS, BILITOT, PROT, ALBUMIN in the last 72 hours.  Recent Labs  04/18/15 0526  WBC 10.9*  HGB 9.3*  HCT 27.5*  MCV 79.9  PLT 449*   No results for input(s): LABPROT, INR in the last 72 hours.    Assessment/Plan: Crohn's colitis with resolving infectious colitis. GI pathogen panel negative but suspect worsened symptoms were infectious in origin. Stable to go home today on PO Abx to complete a 10 day course. Patient would like to switch GI docs to me so please set up to see me in 3-4 weeks. Continue Lialda 4.8 g/day.   [place] C. 04/19/2015, 1:27 PM  Pager [phone]  If no answer or after 5 PM call [phone]

## 2015-04-19 NOTE — Discharge Summary (Signed)
Physician Discharge Summary  Abigail Beck IWO:[id number] DOB: [birth year] DOA: 04/15/2015  PCP: Shellia Carwin, PA-C  Admit date: 04/15/2015 Discharge date: 04/19/2015  Time spent: 30 minutes  Recommendations for Outpatient Follow-up:  1. Follow up with PCP IN 1 to 2 weeks.  2. Follow up with Dr Michail Sermon in 3 to 4 weeks.   Discharge Diagnoses:  Principal Problem:   Diarrhea Active Problems:   Hemorrhoids   Thrombocytosis   Alpha thalassemia   Chronic hepatitis C   Abdominal pain, generalized   Hyponatremia   Protein-calorie malnutrition, severe   Discharge Condition: improved.   Diet recommendation: soft diet   Filed Weights   04/15/15 0759 04/18/15 0640  Weight: 45.949 kg (101 lb 4.8 oz) 46.6 kg (102 lb 11.8 oz)    History of present illness:  Abigail Beck is a 54 y.o. female, with all for thalassemia, idiopathic hep C, possible Crohn's colitis and hemorrhoids. Who presents to the emergency department  with diarrhea  Hospital Course:  Diarrhea probably infectious in addition to IBD. GI pathogen panel pending; but C. difficile negative -Appreciate [facility] consultation. -Patient will continue Cipro/Flagyl empirically, change to po antibiotics on discharge..  -CT abdomen and pelvis suggesting infectious vs inflammatory pancolitis. No abscess or fluid collection -will continue Lialda as recommended by GI  -no need of steroids for now, as per GI rec's  -will add florastor to help with diarrhea   Rectal bleeding -Hemoglobin stable.    Acute kidney injury -Creatinine back to normal after IVF's resuscitation -Likely prerenal due to diarrhea and poor intake.   Alpha thalassemia -Being followed by hematology. Has recently received Feraheme injections July 2016. Continue folic acid and vitamin B-12.  Thrombocytosis (792) -Appears recurrent judging from [facility] records. Possibly elevated at this point as an acute phase  reactant. Monitor CBC. -If persistent will benefit of further work up as an outpatient by outpatient hematology service (patient currently follow by them)   Chronic hepatitis C -Liver biopsy completed on 7/28; it showed chronic hep C that was mildly active and fibrosis in her portal system. -Patient is actively follow by GI  Hyponatremia (128 on admission) -Likely due to hypovolemia and diarrhea. Improved.    Protein calorie malnutrition (severe) Will continue feeding supplements as recommended by nutritional service  Procedures:  none  Consultations:  gastroenterology  Discharge Exam: Filed Vitals:   04/19/15 1324  BP: 114/74  Pulse: 78  Temp: 97.9 F (36.6 C)  Resp: 19    General: alert afebrile comfortable Cardiovascular: s1s2 Respiratory: ctab  Discharge Instructions   Discharge Instructions    Diet general    Complete by:  As directed      Discharge instructions    Complete by:  As directed   Follow up with Dr Michail Sermon as recommended in 3 to 4 weeks.          Current Discharge Medication List    START taking these medications   Details  ciprofloxacin (CIPRO) 500 MG tablet Take 1 tablet (500 mg total) by mouth 2 (two) times daily. Qty: 10 tablet, Refills: 0    mesalamine (LIALDA) 1.2 G EC tablet Take 4 tablets (4.8 g total) by mouth daily with breakfast. Qty: 30 tablet, Refills: 1    metroNIDAZOLE (FLAGYL) 500 MG tablet Take 1 tablet (500 mg total) by mouth every 8 (eight) hours. Qty: 15 tablet, Refills: 0    ondansetron (ZOFRAN) 4 MG tablet Take 1 tablet (4 mg total) by mouth every 6 (six) hours  as needed for nausea. Qty: 20 tablet, Refills: 0    saccharomyces boulardii (FLORASTOR) 250 MG capsule Take 1 capsule (250 mg total) by mouth 2 (two) times daily. Qty: 30 capsule, Refills: 1      CONTINUE these medications which have NOT CHANGED   Details  acetaminophen (TYLENOL) 500 MG tablet Take 500 mg by mouth every 6 (six) hours as needed for  mild pain or moderate pain.    cholecalciferol (VITAMIN D) 1000 UNITS tablet Take 1,000 Units by mouth daily.    folic acid (FOLVITE) 1 MG tablet Take 1 tablet (1 mg total) by mouth daily. Qty: 30 tablet, Refills: 6    Multiple Vitamins-Minerals (MULTIVITAMIN WITH MINERALS) tablet Take 1 tablet by mouth daily.    Omega-3 Fatty Acids (OMEGA 3 PO) Take 1 tablet by mouth daily.     vitamin B-12 (CYANOCOBALAMIN) 50 MCG tablet Take 50 mcg by mouth daily.       Allergies  Allergen Reactions  . Lactose Diarrhea and Nausea Only  . Lisinopril Cough    Other reaction(s): Cough   Follow-up Information    Follow up with Shellia Carwin, PA-C. Schedule an appointment as soon as possible for a visit in 1 week.   Specialty:  Physician Assistant   Contact information:   [place] [address] [phone]       Follow up with Lear Ng., MD In 3 weeks.   Specialty:  Gastroenterology   Contact information:   [address] [place] [address] [phone]        The results of significant diagnostics from this hospitalization (including imaging, microbiology, ancillary and laboratory) are listed below for reference.    Significant Diagnostic Studies: Ct Abdomen Pelvis W Contrast  04/15/2015   CLINICAL DATA:  Abdominal cramping, nausea, watery diarrhea x2 weeks, recent antibiotics  EXAM: CT ABDOMEN AND PELVIS WITH CONTRAST  TECHNIQUE: Multidetector CT imaging of the abdomen and pelvis was performed using the standard protocol following bolus administration of intravenous contrast.  CONTRAST:  49mL OMNIPAQUE IOHEXOL 300 MG/ML  SOLN  COMPARISON:  None.  FINDINGS: Lower chest:  Lung bases are clear.  Hepatobiliary: 8 x 4 mm hypoenhancing lesion anterior segment right hepatic lobe (series 2/ image 26), likely reflecting the site of prior liver biopsy. Liver is otherwise within normal limits. No suspicious/enhance hepatic lesions.  Gallbladder is  unremarkable. No intrahepatic or extrahepatic ductal dilatation dictation.  Pancreas: Within normal limits.  Spleen: Within normal limits.  Adrenals/Urinary Tract: Adrenal glands are within normal limits.  8 mm calcified lesion along the right upper pole (series 2/image 22). Mild renal cortical scarring (series 2/image 28). Left kidney is within normal limits. No hydronephrosis.  Bladder is within normal limits.  Stomach/Bowel: Stomach is within normal limits.  No evidence of bowel obstruction.  Normal appendix.  Diffuse wall thickening involving the colon, suspicious for infectious/inflammatory pancolitis.  Vascular/Lymphatic: No evidence of abdominal aortic aneurysm.  No suspicious abdominopelvic lymphadenopathy.  Reproductive: Multiple uterine fibroids, including a 2.2 cm submucosal fibroid in the anterior uterine body (series 2/image 68).  Bilateral ovaries are within normal limits.  Other: No abdominopelvic ascites.  No drainable fluid collection/abscess.  No free air.  Musculoskeletal: Visualized osseous structures are within normal limits.  IMPRESSION: Findings suggestive of infectious/inflammatory pancolitis, possibly pseudomembranous colitis given the clinical history of recent antibiotic usage.  No drainable fluid collection/abscess.  No free air.   Electronically Signed   By: Julian Hy  M.D.   On: 04/15/2015 18:33   US Biopsy  03/29/2015   CLINICAL DATA:  54 year old female with a history of hepatitis C. She has been referred for image guided medical liver biopsy.  EXAM: ULTRASOUND GUIDED CORE BIOPSY OF LIVER FOR MEDICAL LIVER BIOPSY  MEDICATIONS: 2.0 mg IV Versed; 100 mcg IV Fentanyl  Total Moderate Sedation Time: 12 minutes  PROCEDURE: The procedure, risks, benefits, and alternatives were explained to the patient. Questions regarding the procedure were encouraged and answered. The patient understands and consents to the procedure.  Ultrasound survey of the right liver lobe performed with  images stored and sent to PACs.  The right lower thorax/right upper abdomen was prepped with Betadine in a sterile fashion, and a sterile drape was applied covering the operative field. A sterile gown and sterile gloves were used for the procedure. Local anesthesia was provided with 1% Lidocaine.  Once the patient is prepped and draped sterilely and the skin and subcutaneous tissues were generously infiltrated with 1% lidocaine, a small stab incision was made with an 11 blade scalpel.  A 17 gauge introducer needle was advanced under ultrasound guidance in an intercostal location into the right liver lobe. The stylet was removed, and 3 separate 18 gauge core biopsy were retrieved. Samples were placed into formalin for transportation to the lab.  Three separate Gel-Foam pledgets were then infused with a small amount of saline for assistance with hemostasis.  The needle was removed, and a final ultrasound image was performed.  The patient tolerated the procedure well and remained hemodynamically stable throughout.  No complications were encountered and no significant blood loss was encounter.  COMPLICATIONS: None.  FINDINGS: Ultrasound survey demonstrates safe approach to the right liver lobe via intercostal approach.  Images during the case demonstrate needle tip within the right liver lobe on each pass.  Final image demonstrates gas in the region of Gel-Foam pledgets, with no complicating features.  IMPRESSION: Status post ultrasound-guided medical liver biopsy, with tissue specimen sent to pathology for complete histopathologic analysis.  Signed,  Dulcy Fanny. Earleen Newport, DO  [facility]  [facility]   Electronically Signed   By: Corrie Mckusick D.O.   On: 03/29/2015 14:42    Microbiology: Recent Results (from the past 240 hour(s))  Clostridium Difficile by PCR     Status: None   Collection Time: 04/15/15 10:00 AM  Result Value Ref Range Status   Toxigenic C Difficile by  pcr NEGATIVE NEGATIVE Final     Labs: Basic Metabolic Panel:  Recent Labs Lab 04/15/15 0805 04/17/15 0305 04/18/15 0526  NA 128* 132* 133*  K 3.4* 4.2 4.5  CL 91* 104 110  CO2 24 22 18*  GLUCOSE 117* 95 116*  BUN 18 7 6   CREATININE 1.20* 0.85 0.77  CALCIUM 8.9 8.6* 8.1*   Liver Function Tests:  Recent Labs Lab 04/15/15 0805  AST 22  ALT 17  ALKPHOS 231*  BILITOT 0.6  PROT 7.3  ALBUMIN 1.9*    Recent Labs Lab 04/15/15 0805  LIPASE 17*   No results for input(s): AMMONIA in the last 168 hours. CBC:  Recent Labs Lab 04/15/15 0805 04/18/15 0526  WBC 14.5* 10.9*  HGB 11.9* 9.3*  HCT 34.9* 27.5*  MCV 78.4 79.9  PLT 792* 449*   Cardiac Enzymes: No results for input(s): CKTOTAL, CKMB, CKMBINDEX, TROPONINI in the last 168 hours. BNP: BNP (last 3 results) No results for input(s): BNP in the last 8760 hours.  ProBNP (  last 3 results) No results for input(s): PROBNP in the last 8760 hours.  CBG: No results for input(s): GLUCAP in the last 168 hours.     SignedHosie Poisson  [facility] 04/19/2015, 2:43 PM

## 2015-04-19 NOTE — Progress Notes (Signed)
[name] to be D/C'd Home per MD order.  Discussed with the patient and all questions fully answered.  VSS, Skin clean, dry and intact without evidence of skin break down, no evidence of skin tears noted. IV catheter discontinued intact. Site without signs and symptoms of complications. Dressing and pressure applied.  An After Visit Summary was printed and given to the patient. Patient received prescription.  D/c education completed with patient/family including follow up instructions, medication list, d/c activities limitations if indicated, with other d/c instructions as indicated by MD - patient able to verbalize understanding, all questions fully answered.   Patient instructed to return to ED, call 911, or call MD for any changes in condition.   Patient will be  escorted via [facility] when she is ready, and D/C home via private auto.  Norva Karvonen 04/19/2015 3:12 PM

## 2015-04-24 ENCOUNTER — Ambulatory Visit (HOSPITAL_COMMUNITY): Payer: BLUE CROSS/BLUE SHIELD

## 2015-04-25 DIAGNOSIS — K5100 Ulcerative (chronic) pancolitis without complications: Secondary | ICD-10-CM | POA: Insufficient documentation

## 2015-06-15 ENCOUNTER — Encounter: Payer: Self-pay | Admitting: Hematology & Oncology

## 2015-06-15 ENCOUNTER — Other Ambulatory Visit (HOSPITAL_BASED_OUTPATIENT_CLINIC_OR_DEPARTMENT_OTHER): Payer: BLUE CROSS/BLUE SHIELD

## 2015-06-15 ENCOUNTER — Ambulatory Visit (HOSPITAL_BASED_OUTPATIENT_CLINIC_OR_DEPARTMENT_OTHER): Payer: BLUE CROSS/BLUE SHIELD | Admitting: Hematology & Oncology

## 2015-06-15 ENCOUNTER — Ambulatory Visit (HOSPITAL_BASED_OUTPATIENT_CLINIC_OR_DEPARTMENT_OTHER): Payer: BLUE CROSS/BLUE SHIELD

## 2015-06-15 VITALS — BP 132/74 | HR 74 | Temp 97.6°F | Resp 16 | Ht 64.0 in | Wt 115.0 lb

## 2015-06-15 DIAGNOSIS — D509 Iron deficiency anemia, unspecified: Secondary | ICD-10-CM

## 2015-06-15 DIAGNOSIS — K50819 Crohn's disease of both small and large intestine with unspecified complications: Secondary | ICD-10-CM

## 2015-06-15 DIAGNOSIS — D500 Iron deficiency anemia secondary to blood loss (chronic): Secondary | ICD-10-CM | POA: Diagnosis not present

## 2015-06-15 DIAGNOSIS — D62 Acute posthemorrhagic anemia: Secondary | ICD-10-CM

## 2015-06-15 DIAGNOSIS — K5090 Crohn's disease, unspecified, without complications: Secondary | ICD-10-CM | POA: Diagnosis not present

## 2015-06-15 HISTORY — DX: Crohn's disease of both small and large intestine with unspecified complications: K50.819

## 2015-06-15 LAB — CBC WITH DIFFERENTIAL (CANCER CENTER ONLY)
BASO#: 0 10*3/uL (ref 0.0–0.2)
BASO%: 0.5 % (ref 0.0–2.0)
EOS%: 1.3 % (ref 0.0–7.0)
Eosinophils Absolute: 0.1 10*3/uL (ref 0.0–0.5)
HCT: 37 % (ref 34.8–46.6)
HEMOGLOBIN: 12.2 g/dL (ref 11.6–15.9)
LYMPH#: 2.2 10*3/uL (ref 0.9–3.3)
LYMPH%: 36.8 % (ref 14.0–48.0)
MCH: 27.9 pg (ref 26.0–34.0)
MCHC: 33 g/dL (ref 32.0–36.0)
MCV: 85 fL (ref 81–101)
MONO#: 0.5 10*3/uL (ref 0.1–0.9)
MONO%: 8.2 % (ref 0.0–13.0)
NEUT%: 53.2 % (ref 39.6–80.0)
NEUTROS ABS: 3.2 10*3/uL (ref 1.5–6.5)
Platelets: 489 10*3/uL — ABNORMAL HIGH (ref 145–400)
RBC: 4.38 10*6/uL (ref 3.70–5.32)
RDW: 14.3 % (ref 11.1–15.7)
WBC: 6 10*3/uL (ref 3.9–10.0)

## 2015-06-15 LAB — IRON AND TIBC CHCC
%SAT: 12 % — AB (ref 21–57)
IRON: 46 ug/dL (ref 41–142)
TIBC: 393 ug/dL (ref 236–444)
UIBC: 347 ug/dL (ref 120–384)

## 2015-06-15 LAB — FERRITIN CHCC: FERRITIN: 22 ng/mL (ref 9–269)

## 2015-06-15 MED ORDER — SODIUM CHLORIDE 0.9 % IV SOLN
510.0000 mg | Freq: Once | INTRAVENOUS | Status: DC
  Administered 2015-06-15: 510 mg via INTRAVENOUS
  Filled 2015-06-15: qty 17

## 2015-06-15 NOTE — Progress Notes (Signed)
Hematology and Oncology Follow Up Visit  Abigail Beck [id number] [birth year] 54 y.o. 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 She received 2 doses of Feraheme in May. This overall quite a bit. In July, her iron studies showed a ferritin of 87 with an iron saturation of 11%. Pressure apparently had some problems with her Crohn's over the summer. She had an abdominal CT scan done. She was noted to have some pancolitis. It was felt his management from antibiotics.  She says that there is some rectal bleeding. This only happens with bowel movements.  She's had a fever.  She's had no joint issues. She's had no leg swelling. She's had no rashes.  Overall, her performance status is ECOG 1.  Medications:    Medication List       This list is accurate as of: 06/15/15  8:58 AM.  Always use your most recent med list.               cholecalciferol 1000 UNITS tablet  Commonly known as:  VITAMIN D  Take 1,000 Units by mouth daily.     folic acid 1 MG tablet  Commonly known as:  FOLVITE  Take 1 tablet (1 mg total) by mouth daily.     mesalamine 1.2 G EC tablet  Commonly known as:  LIALDA  Take 4 tablets (4.8 g total) by mouth daily with breakfast.     multivitamin with minerals tablet  Take 1 tablet by mouth daily.     OMEGA 3 PO  Take 1 tablet by mouth daily.     vitamin B-12 50 MCG tablet  Commonly known as:  CYANOCOBALAMIN  Take 50 mcg by mouth daily.        Allergies:  Allergies  Allergen Reactions  . Lactose Diarrhea and Nausea Only  . Lisinopril Cough    Other reaction(s): Cough    Past Medical History, Surgical history, Social history, and Family History were reviewed and updated.  Review of Systems: All other 10 point review of  systems is negative.   Physical Exam:  height is 5\' 4"  (1.626 m) and weight is 115 lb (52.164 kg). Her oral temperature is 97.6 F (36.4 C). Her blood pressure is 132/74 and her pulse is 74. Her respiration is 16.   Wt Readings from Last 3 Encounters:  06/15/15 115 lb (52.164 kg)  04/18/15 102 lb 11.8 oz (46.6 kg)  03/15/15 115 lb (52.164 kg)    Ocular: Sclerae unicteric, pupils equal, round and reactive to light Ear-nose-throat: Oropharynx clear, dentition fair Lymphatic: No cervical or supraclavicular adenopathy Lungs no rales or rhonchi, good excursion bilaterally Heart regular rate and rhythm, no murmur appreciated Abd soft, nontender, positive bowel sounds MSK no focal spinal tenderness, no joint edema Neuro: non-focal, well-oriented, appropriate affect Breasts: Deferred  Lab Results  Component Value Date   WBC 6.0 06/15/2015   HGB 12.2 06/15/2015   HCT 37.0 06/15/2015   MCV 85 06/15/2015   PLT 489* 06/15/2015   Lab Results  Component Value Date   FERRITIN 87 03/15/2015   IRON 37* 03/15/2015   TIBC 346 03/15/2015   UIBC 309 03/15/2015   IRONPCTSAT 11* 03/15/2015   Lab Results  Component Value Date   RETICCTPCT 2.6* 01/23/2015   RBC 4.38 06/15/2015  RETICCTABS 98.0 01/23/2015   No results found for: KPAFRELGTCHN, LAMBDASER, KAPLAMBRATIO No results found for: IGGSERUM, IGA, IGMSERUM No results found for: Ronnald Ramp, A1GS, A2GS, Tillman Sers, SPEI   Chemistry      Component Value Date/Time   NA 136 04/19/2015 1428   K 4.6 04/19/2015 1428   CL 113* 04/19/2015 1428   CO2 17* 04/19/2015 1428   BUN <5* 04/19/2015 1428   CREATININE 0.75 04/19/2015 1428      Component Value Date/Time   CALCIUM 8.6* 04/19/2015 1428   ALKPHOS 231* 04/15/2015 0805   AST 22 04/15/2015 0805   ALT 17 04/15/2015 0805   BILITOT 0.6 04/15/2015 0805     Impression and Plan: Abigail Beck is a very pleasant 54 yo African American female with a  history of iron deficiency anemia, alpha thalassemia and thrombocytosis. She has Crohn's disease. His only she had a flareup over the summer.  I believe that her blood smear. There is a more microcytosis.  I think that with her platelet count going up, and her hemoglobin going down a little bit, I really believe that she is becoming iron deficient.  As such, we will go ahead and give her dose of IV Feraheme. She responded very well last time. Her blood count came down nicely.  We will plan to get her back in 3 months. We will get her through the holidays. She will be planning on going to [place] state to visit a son over the Christmas holidays.   I also told her to take a therapeutic dose of folic acid. She take this over-the-counter.    Volanda Napoleon, MD 10/14/20168:58 AM

## 2015-06-15 NOTE — Patient Instructions (Signed)

## 2015-07-31 ENCOUNTER — Other Ambulatory Visit: Payer: Self-pay | Admitting: Gastroenterology

## 2015-08-08 ENCOUNTER — Ambulatory Visit
Admission: RE | Admit: 2015-08-08 | Discharge: 2015-08-08 | Disposition: A | Discharge status: Home / Self Care | Payer: BLUE CROSS/BLUE SHIELD | Source: Ambulatory Visit | Attending: Gastroenterology | Admitting: Gastroenterology

## 2015-08-08 ENCOUNTER — Other Ambulatory Visit: Payer: Self-pay | Admitting: Gastroenterology

## 2015-08-08 DIAGNOSIS — K529 Noninfective gastroenteritis and colitis, unspecified: Secondary | ICD-10-CM

## 2015-09-18 ENCOUNTER — Ambulatory Visit: Payer: BLUE CROSS/BLUE SHIELD

## 2015-09-18 ENCOUNTER — Other Ambulatory Visit: Payer: BLUE CROSS/BLUE SHIELD

## 2015-09-18 ENCOUNTER — Ambulatory Visit: Payer: BLUE CROSS/BLUE SHIELD | Admitting: Family

## 2017-12-22 DIAGNOSIS — D573 Sickle-cell trait: Secondary | ICD-10-CM | POA: Insufficient documentation

## 2020-02-16 ENCOUNTER — Other Ambulatory Visit: Payer: Self-pay

## 2020-03-08 ENCOUNTER — Other Ambulatory Visit: Payer: Self-pay | Admitting: Family

## 2020-03-08 ENCOUNTER — Inpatient Hospital Stay: Payer: No Typology Code available for payment source | Attending: Hematology & Oncology

## 2020-03-08 ENCOUNTER — Other Ambulatory Visit: Payer: Self-pay

## 2020-03-08 ENCOUNTER — Encounter: Payer: Self-pay | Admitting: Family

## 2020-03-08 ENCOUNTER — Inpatient Hospital Stay (HOSPITAL_BASED_OUTPATIENT_CLINIC_OR_DEPARTMENT_OTHER): Payer: No Typology Code available for payment source | Admitting: Family

## 2020-03-08 DIAGNOSIS — R0602 Shortness of breath: Secondary | ICD-10-CM | POA: Diagnosis not present

## 2020-03-08 DIAGNOSIS — R202 Paresthesia of skin: Secondary | ICD-10-CM | POA: Insufficient documentation

## 2020-03-08 DIAGNOSIS — Z79899 Other long term (current) drug therapy: Secondary | ICD-10-CM | POA: Diagnosis not present

## 2020-03-08 DIAGNOSIS — K50911 Crohn's disease, unspecified, with rectal bleeding: Secondary | ICD-10-CM | POA: Diagnosis not present

## 2020-03-08 DIAGNOSIS — D508 Other iron deficiency anemias: Secondary | ICD-10-CM

## 2020-03-08 DIAGNOSIS — R002 Palpitations: Secondary | ICD-10-CM | POA: Insufficient documentation

## 2020-03-08 DIAGNOSIS — D509 Iron deficiency anemia, unspecified: Secondary | ICD-10-CM | POA: Insufficient documentation

## 2020-03-08 DIAGNOSIS — R7989 Other specified abnormal findings of blood chemistry: Secondary | ICD-10-CM | POA: Insufficient documentation

## 2020-03-08 DIAGNOSIS — D649 Anemia, unspecified: Secondary | ICD-10-CM

## 2020-03-08 DIAGNOSIS — D560 Alpha thalassemia: Secondary | ICD-10-CM | POA: Insufficient documentation

## 2020-03-08 DIAGNOSIS — R5383 Other fatigue: Secondary | ICD-10-CM | POA: Insufficient documentation

## 2020-03-08 LAB — CBC WITH DIFFERENTIAL (CANCER CENTER ONLY)
Abs Immature Granulocytes: 0.04 10*3/uL (ref 0.00–0.07)
Basophils Absolute: 0.1 10*3/uL (ref 0.0–0.1)
Basophils Relative: 1 %
Eosinophils Absolute: 0.1 10*3/uL (ref 0.0–0.5)
Eosinophils Relative: 2 %
HCT: 26.2 % — ABNORMAL LOW (ref 36.0–46.0)
Hemoglobin: 7.5 g/dL — ABNORMAL LOW (ref 12.0–15.0)
Immature Granulocytes: 1 %
Lymphocytes Relative: 40 %
Lymphs Abs: 2 10*3/uL (ref 0.7–4.0)
MCH: 22.2 pg — ABNORMAL LOW (ref 26.0–34.0)
MCHC: 28.6 g/dL — ABNORMAL LOW (ref 30.0–36.0)
MCV: 77.5 fL — ABNORMAL LOW (ref 80.0–100.0)
Monocytes Absolute: 0.4 10*3/uL (ref 0.1–1.0)
Monocytes Relative: 7 %
Neutro Abs: 2.5 10*3/uL (ref 1.7–7.7)
Neutrophils Relative %: 49 %
Platelet Count: 423 10*3/uL — ABNORMAL HIGH (ref 150–400)
RBC: 3.38 MIL/uL — ABNORMAL LOW (ref 3.87–5.11)
RDW: 19.7 % — ABNORMAL HIGH (ref 11.5–15.5)
WBC Count: 5.1 10*3/uL (ref 4.0–10.5)
nRBC: 0 % (ref 0.0–0.2)

## 2020-03-08 LAB — CMP (CANCER CENTER ONLY)
ALT: 36 U/L (ref 0–44)
AST: 43 U/L — ABNORMAL HIGH (ref 15–41)
Albumin: 3.8 g/dL (ref 3.5–5.0)
Alkaline Phosphatase: 267 U/L — ABNORMAL HIGH (ref 38–126)
Anion gap: 5 (ref 5–15)
BUN: 14 mg/dL (ref 6–20)
CO2: 26 mmol/L (ref 22–32)
Calcium: 9.4 mg/dL (ref 8.9–10.3)
Chloride: 108 mmol/L (ref 98–111)
Creatinine: 0.86 mg/dL (ref 0.44–1.00)
GFR, Est AFR Am: >60 mL/min (ref >60)
GFR, Estimated: >60 mL/min (ref >60)
Glucose, Bld: 110 mg/dL — ABNORMAL HIGH (ref 70–99)
Potassium: 3.8 mmol/L (ref 3.5–5.1)
Sodium: 139 mmol/L (ref 135–145)
Total Bilirubin: 0.4 mg/dL (ref 0.3–1.2)
Total Protein: 8.1 g/dL (ref 6.5–8.1)

## 2020-03-08 LAB — LACTATE DEHYDROGENASE: LDH: 221 U/L — ABNORMAL HIGH (ref 98–192)

## 2020-03-08 LAB — RETICULOCYTES
Immature Retic Fract: 23.3 % — ABNORMAL HIGH (ref 2.3–15.9)
RBC.: 3.36 MIL/uL — ABNORMAL LOW (ref 3.87–5.11)
Retic Count, Absolute: 52.8 10*3/uL (ref 19.0–186.0)
Retic Ct Pct: 1.6 % (ref 0.4–3.1)

## 2020-03-08 LAB — SAVE SMEAR(SSMR), FOR PROVIDER SLIDE REVIEW

## 2020-03-08 NOTE — Progress Notes (Signed)
Hematology and Oncology Follow Up Visit  Abigail Beck [id number] [birth year] 59 y.o. 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 We last saw her in 2016.  She has been noting increased fatigue, SOB with over exertion, craving ice and occasional palpitations.  She recently restarted her folic acid daily and feels that her symptoms are starting to improved.  She is still able to run on her treadmill but takes frequent breaks to rest.  Hgb is down to 7.5, MCV 77 and platelets 423.  She states that she sometimes has bright red blood in her stool when straining with a BM. She that she has hemorrhoids. No other blood loss noted.  She states that she has not had a Crohn's flare since before Covid.  No bruising or petechiae.   She has gone through the change of life naturally. Female organs are still in place.  No fever, chills, n/v, cough, rash, dizziness, chest pain, abdominal pain or changes in bowel or bladder habits.  No swelling, tenderness, numbness or tingling in her extremities at this time. She has intermittent tingling in her left thumb.   No falls or syncopal episodes to report.  She has maintained a good appetite and is staying well hydrated. Her weight is described as stable.   ECOG Performance Status: 1 - Symptomatic but completely ambulatory  Medications:  Allergies as of 03/08/2020      Reactions   Lactose Diarrhea, Nausea Only   Lisinopril Cough   Other reaction(s): Cough      Medication List       Accurate as of March 08, 2020  1:51 PM. If you have any questions, ask your nurse or doctor.        STOP taking these medications   cholecalciferol 1000 units tablet Commonly known as: VITAMIN  D Stopped by: Laverna Peace, NP   folic acid 1 MG tablet Commonly known as: FOLVITE Stopped by: Laverna Peace, NP   mesalamine 1.2 g EC tablet Commonly known as: LIALDA Stopped by: Laverna Peace, NP   multivitamin with minerals tablet Stopped by: Laverna Peace, NP   OMEGA 3 PO Stopped by: Laverna Peace, NP   vitamin B-12 50 MCG tablet Commonly known as: CYANOCOBALAMIN Stopped by: Laverna Peace, NP       Allergies:  Allergies  Allergen Reactions  . Lactose Diarrhea and Nausea Only  . Lisinopril Cough    Other reaction(s): Cough    Past Medical History, Surgical history, Social history, and Family History were reviewed and updated.  Review of Systems: All other 10 point review of systems is negative.   Physical Exam:  height is 5\' 4"  (1.626 m) and weight is 167 lb 1.9 oz (75.8 kg). Her oral temperature is 99.1 F (37.3 C). Her blood pressure is 134/84 and her pulse is 74. Her respiration is 20 and oxygen saturation is 99%.   Wt Readings from Last 3 Encounters:  03/08/20 167 lb 1.9 oz (75.8 kg)  06/15/15 115 lb (52.2 kg)  04/18/15 102 lb 11.8 oz (46.6 kg)    Ocular: Sclerae unicteric, pupils equal, round and reactive to light Ear-nose-throat: Oropharynx clear, dentition fair Lymphatic: No cervical or supraclavicular adenopathy Lungs no rales or rhonchi, good  excursion bilaterally Heart regular rate and rhythm, no murmur appreciated Abd soft, nontender, positive bowel sounds, no liver or spleen tip palpated on exam, no fluid wave  MSK no focal spinal tenderness, no joint edema Neuro: non-focal, well-oriented, appropriate affect Breasts: Deferred   Lab Results  Component Value Date   WBC 5.1 03/08/2020   HGB 7.5 (L) 03/08/2020   HCT 26.2 (L) 03/08/2020   MCV 77.5 (L) 03/08/2020   PLT 423 (H) 03/08/2020   Lab Results  Component Value Date   FERRITIN 22 06/15/2015   IRON 46 06/15/2015   TIBC 393 06/15/2015   UIBC 347 06/15/2015    IRONPCTSAT 12 (L) 06/15/2015   Lab Results  Component Value Date   RETICCTPCT 1.6 03/08/2020   RBC 3.36 (L) 03/08/2020   RETICCTABS 98.0 01/23/2015   No results found for: KPAFRELGTCHN, LAMBDASER, KAPLAMBRATIO No results found for: IGGSERUM, IGA, IGMSERUM No results found for: TOTALPROTELP, ALBUMINELP, A1GS, A2GS, BETS, BETA2SER, GAMS, MSPIKE, SPEI   Chemistry      Component Value Date/Time   NA 136 04/19/2015 1428   K 4.6 04/19/2015 1428   CL 113 (H) 04/19/2015 1428   CO2 17 (L) 04/19/2015 1428   BUN <5 (L) 04/19/2015 1428   CREATININE 0.75 04/19/2015 1428      Component Value Date/Time   CALCIUM 8.6 (L) 04/19/2015 1428   ALKPHOS 231 (H) 04/15/2015 0805   AST 22 04/15/2015 0805   ALT 17 04/15/2015 0805   BILITOT 0.6 04/15/2015 0805       Impression and Plan: Abigail Beck is a very pleasant 59 yo African American female with a history of iron deficiency anemia, alpha thalassemia and thrombocytosis. She has Crohn's disease.  She is symptomatic with Hgb of 7.5 but not in any distress at this time.  We will get her iron studies back and get her set up for infusions if needed.  She will continue taking her folic acid supplement daily.  We will plan to see her again in 6 weeks.  She can contact our office with any questions or concerns.   Laverna Peace, NP 7/8/20211:51 PM

## 2020-03-09 ENCOUNTER — Other Ambulatory Visit: Payer: Self-pay | Admitting: Family

## 2020-03-09 LAB — IRON AND TIBC
Iron: 20 ug/dL — ABNORMAL LOW (ref 41–142)
Saturation Ratios: 4 % — ABNORMAL LOW (ref 21–57)
TIBC: 541 ug/dL — ABNORMAL HIGH (ref 236–444)
UIBC: 522 ug/dL — ABNORMAL HIGH (ref 120–384)

## 2020-03-09 LAB — FERRITIN: Ferritin: <4 ng/mL — ABNORMAL LOW (ref 11–307)

## 2020-03-12 ENCOUNTER — Telehealth: Payer: Self-pay | Admitting: Family

## 2020-03-12 LAB — ERYTHROPOIETIN: Erythropoietin: 161.8 m[IU]/mL — ABNORMAL HIGH (ref 2.6–18.5)

## 2020-03-12 NOTE — Telephone Encounter (Signed)
Called and spoke with patient regarding appointments scheduled for iron infusion. She requested early AM appointments for some of the appointments per 7/9 sch msg

## 2020-03-16 ENCOUNTER — Inpatient Hospital Stay: Payer: No Typology Code available for payment source

## 2020-03-16 ENCOUNTER — Other Ambulatory Visit: Payer: Self-pay

## 2020-03-16 VITALS — BP 134/83 | HR 62 | Temp 98.3°F | Resp 17

## 2020-03-16 DIAGNOSIS — R7989 Other specified abnormal findings of blood chemistry: Secondary | ICD-10-CM | POA: Diagnosis not present

## 2020-03-16 DIAGNOSIS — R002 Palpitations: Secondary | ICD-10-CM | POA: Diagnosis not present

## 2020-03-16 DIAGNOSIS — R0602 Shortness of breath: Secondary | ICD-10-CM | POA: Diagnosis not present

## 2020-03-16 DIAGNOSIS — K50911 Crohn's disease, unspecified, with rectal bleeding: Secondary | ICD-10-CM | POA: Diagnosis not present

## 2020-03-16 DIAGNOSIS — D560 Alpha thalassemia: Secondary | ICD-10-CM | POA: Diagnosis not present

## 2020-03-16 DIAGNOSIS — D509 Iron deficiency anemia, unspecified: Secondary | ICD-10-CM | POA: Diagnosis not present

## 2020-03-16 DIAGNOSIS — R5383 Other fatigue: Secondary | ICD-10-CM | POA: Diagnosis not present

## 2020-03-16 DIAGNOSIS — R202 Paresthesia of skin: Secondary | ICD-10-CM | POA: Diagnosis not present

## 2020-03-16 DIAGNOSIS — Z79899 Other long term (current) drug therapy: Secondary | ICD-10-CM | POA: Diagnosis not present

## 2020-03-16 DIAGNOSIS — D508 Other iron deficiency anemias: Secondary | ICD-10-CM

## 2020-03-16 MED ORDER — SODIUM CHLORIDE 0.9 % IV SOLN
Freq: Once | INTRAVENOUS | Status: AC
  Filled 2020-03-16: qty 250

## 2020-03-16 MED ORDER — SODIUM CHLORIDE 0.9 % IV SOLN
200.0000 mg | Freq: Once | INTRAVENOUS | Status: AC
  Administered 2020-03-16: 200 mg via INTRAVENOUS
  Filled 2020-03-16: qty 200

## 2020-03-16 NOTE — Patient Instructions (Signed)

## 2020-03-21 ENCOUNTER — Telehealth: Payer: Self-pay | Admitting: Family

## 2020-03-21 NOTE — Telephone Encounter (Signed)
Appointments rescheduled due to provider out of office/ updated calendar mailed

## 2020-03-23 ENCOUNTER — Inpatient Hospital Stay: Payer: No Typology Code available for payment source

## 2020-03-23 ENCOUNTER — Other Ambulatory Visit: Payer: Self-pay

## 2020-03-23 VITALS — BP 128/84 | HR 63 | Temp 98.0°F | Resp 17

## 2020-03-23 DIAGNOSIS — D509 Iron deficiency anemia, unspecified: Secondary | ICD-10-CM | POA: Diagnosis not present

## 2020-03-23 DIAGNOSIS — D508 Other iron deficiency anemias: Secondary | ICD-10-CM

## 2020-03-23 MED ORDER — SODIUM CHLORIDE 0.9 % IV SOLN
Freq: Once | INTRAVENOUS | Status: AC
  Filled 2020-03-23: qty 250

## 2020-03-23 MED ORDER — SODIUM CHLORIDE 0.9 % IV SOLN
200.0000 mg | Freq: Once | INTRAVENOUS | Status: AC
  Administered 2020-03-23: 200 mg via INTRAVENOUS
  Filled 2020-03-23: qty 200

## 2020-03-23 NOTE — Patient Instructions (Signed)

## 2020-03-30 ENCOUNTER — Inpatient Hospital Stay: Payer: No Typology Code available for payment source

## 2020-03-30 ENCOUNTER — Other Ambulatory Visit: Payer: Self-pay

## 2020-03-30 VITALS — BP 133/82 | HR 61 | Temp 99.1°F | Resp 17

## 2020-03-30 DIAGNOSIS — D508 Other iron deficiency anemias: Secondary | ICD-10-CM

## 2020-03-30 DIAGNOSIS — D509 Iron deficiency anemia, unspecified: Secondary | ICD-10-CM | POA: Diagnosis not present

## 2020-03-30 MED ORDER — SODIUM CHLORIDE 0.9 % IV SOLN
Freq: Once | INTRAVENOUS | Status: AC
  Filled 2020-03-30: qty 250

## 2020-03-30 MED ORDER — SODIUM CHLORIDE 0.9 % IV SOLN
200.0000 mg | Freq: Once | INTRAVENOUS | Status: AC
  Administered 2020-03-30: 200 mg via INTRAVENOUS
  Filled 2020-03-30: qty 200

## 2020-03-30 NOTE — Patient Instructions (Signed)

## 2020-04-06 ENCOUNTER — Inpatient Hospital Stay: Payer: No Typology Code available for payment source | Attending: Hematology & Oncology

## 2020-04-06 ENCOUNTER — Other Ambulatory Visit: Payer: Self-pay

## 2020-04-06 VITALS — BP 117/66 | HR 63 | Temp 98.1°F | Resp 18

## 2020-04-06 DIAGNOSIS — D508 Other iron deficiency anemias: Secondary | ICD-10-CM

## 2020-04-06 DIAGNOSIS — Z79899 Other long term (current) drug therapy: Secondary | ICD-10-CM | POA: Diagnosis not present

## 2020-04-06 DIAGNOSIS — R5383 Other fatigue: Secondary | ICD-10-CM | POA: Diagnosis not present

## 2020-04-06 DIAGNOSIS — D509 Iron deficiency anemia, unspecified: Secondary | ICD-10-CM | POA: Diagnosis not present

## 2020-04-06 DIAGNOSIS — K50911 Crohn's disease, unspecified, with rectal bleeding: Secondary | ICD-10-CM | POA: Diagnosis not present

## 2020-04-06 DIAGNOSIS — R7989 Other specified abnormal findings of blood chemistry: Secondary | ICD-10-CM | POA: Diagnosis not present

## 2020-04-06 DIAGNOSIS — Z8619 Personal history of other infectious and parasitic diseases: Secondary | ICD-10-CM | POA: Diagnosis not present

## 2020-04-06 DIAGNOSIS — D560 Alpha thalassemia: Secondary | ICD-10-CM | POA: Insufficient documentation

## 2020-04-06 MED ORDER — SODIUM CHLORIDE 0.9 % IV SOLN
INTRAVENOUS | Status: DC
  Filled 2020-04-06 (×2): qty 250

## 2020-04-06 MED ORDER — SODIUM CHLORIDE 0.9 % IV SOLN
200.0000 mg | Freq: Once | INTRAVENOUS | Status: AC
  Administered 2020-04-06: 200 mg via INTRAVENOUS
  Filled 2020-04-06: qty 200

## 2020-04-06 NOTE — Progress Notes (Signed)
Pt offered nutrition and fluids, unable to stay x30 min observation

## 2020-04-06 NOTE — Patient Instructions (Signed)

## 2020-04-13 ENCOUNTER — Inpatient Hospital Stay: Payer: No Typology Code available for payment source

## 2020-04-13 ENCOUNTER — Other Ambulatory Visit: Payer: Self-pay

## 2020-04-13 VITALS — BP 125/74 | HR 62 | Temp 98.3°F | Resp 18

## 2020-04-13 DIAGNOSIS — D508 Other iron deficiency anemias: Secondary | ICD-10-CM

## 2020-04-13 DIAGNOSIS — D509 Iron deficiency anemia, unspecified: Secondary | ICD-10-CM | POA: Diagnosis not present

## 2020-04-13 MED ORDER — SODIUM CHLORIDE 0.9 % IV SOLN
Freq: Once | INTRAVENOUS | Status: AC
  Filled 2020-04-13: qty 250

## 2020-04-13 MED ORDER — SODIUM CHLORIDE 0.9 % IV SOLN
200.0000 mg | Freq: Once | INTRAVENOUS | Status: AC
  Administered 2020-04-13: 200 mg via INTRAVENOUS
  Filled 2020-04-13: qty 200

## 2020-04-13 NOTE — Patient Instructions (Signed)

## 2020-04-19 ENCOUNTER — Encounter: Payer: Self-pay | Admitting: Family

## 2020-04-19 ENCOUNTER — Ambulatory Visit: Payer: No Typology Code available for payment source | Admitting: Family

## 2020-04-19 ENCOUNTER — Inpatient Hospital Stay: Payer: No Typology Code available for payment source

## 2020-04-19 ENCOUNTER — Other Ambulatory Visit: Payer: Self-pay

## 2020-04-19 ENCOUNTER — Inpatient Hospital Stay (HOSPITAL_BASED_OUTPATIENT_CLINIC_OR_DEPARTMENT_OTHER): Payer: No Typology Code available for payment source | Admitting: Family

## 2020-04-19 ENCOUNTER — Other Ambulatory Visit: Payer: No Typology Code available for payment source

## 2020-04-19 ENCOUNTER — Telehealth: Payer: Self-pay | Admitting: Family

## 2020-04-19 VITALS — BP 147/81 | HR 59 | Temp 98.6°F | Resp 18 | Ht 64.0 in | Wt 160.0 lb

## 2020-04-19 DIAGNOSIS — D649 Anemia, unspecified: Secondary | ICD-10-CM | POA: Diagnosis not present

## 2020-04-19 DIAGNOSIS — D509 Iron deficiency anemia, unspecified: Secondary | ICD-10-CM | POA: Diagnosis not present

## 2020-04-19 DIAGNOSIS — D500 Iron deficiency anemia secondary to blood loss (chronic): Secondary | ICD-10-CM | POA: Diagnosis not present

## 2020-04-19 DIAGNOSIS — D560 Alpha thalassemia: Secondary | ICD-10-CM

## 2020-04-19 DIAGNOSIS — D508 Other iron deficiency anemias: Secondary | ICD-10-CM

## 2020-04-19 LAB — CMP (CANCER CENTER ONLY)
ALT: 77 U/L — ABNORMAL HIGH (ref 0–44)
AST: 86 U/L — ABNORMAL HIGH (ref 15–41)
Albumin: 4.1 g/dL (ref 3.5–5.0)
Alkaline Phosphatase: 271 U/L — ABNORMAL HIGH (ref 38–126)
Anion gap: 7 (ref 5–15)
BUN: 14 mg/dL (ref 6–20)
CO2: 26 mmol/L (ref 22–32)
Calcium: 10 mg/dL (ref 8.9–10.3)
Chloride: 105 mmol/L (ref 98–111)
Creatinine: 0.88 mg/dL (ref 0.44–1.00)
GFR, Est AFR Am: >60 mL/min (ref >60)
GFR, Estimated: >60 mL/min (ref >60)
Glucose, Bld: 84 mg/dL (ref 70–99)
Potassium: 3.9 mmol/L (ref 3.5–5.1)
Sodium: 138 mmol/L (ref 135–145)
Total Bilirubin: 0.5 mg/dL (ref 0.3–1.2)
Total Protein: 8.4 g/dL — ABNORMAL HIGH (ref 6.5–8.1)

## 2020-04-19 LAB — CBC WITH DIFFERENTIAL (CANCER CENTER ONLY)
Abs Immature Granulocytes: 0.05 10*3/uL (ref 0.00–0.07)
Basophils Absolute: 0.1 10*3/uL (ref 0.0–0.1)
Basophils Relative: 1 %
Eosinophils Absolute: 0 10*3/uL (ref 0.0–0.5)
Eosinophils Relative: 1 %
HCT: 35.6 % — ABNORMAL LOW (ref 36.0–46.0)
Hemoglobin: 11 g/dL — ABNORMAL LOW (ref 12.0–15.0)
Immature Granulocytes: 1 %
Lymphocytes Relative: 40 %
Lymphs Abs: 1.8 10*3/uL (ref 0.7–4.0)
MCH: 25.6 pg — ABNORMAL LOW (ref 26.0–34.0)
MCHC: 30.9 g/dL (ref 30.0–36.0)
MCV: 82.8 fL (ref 80.0–100.0)
Monocytes Absolute: 0.3 10*3/uL (ref 0.1–1.0)
Monocytes Relative: 7 %
Neutro Abs: 2.3 10*3/uL (ref 1.7–7.7)
Neutrophils Relative %: 50 %
Platelet Count: 243 10*3/uL (ref 150–400)
RBC: 4.3 MIL/uL (ref 3.87–5.11)
RDW: 22.2 % — ABNORMAL HIGH (ref 11.5–15.5)
WBC Count: 4.6 10*3/uL (ref 4.0–10.5)
nRBC: 0 % (ref 0.0–0.2)

## 2020-04-19 LAB — RETICULOCYTES
Immature Retic Fract: 14.8 % (ref 2.3–15.9)
RBC.: 4.29 MIL/uL (ref 3.87–5.11)
Retic Count, Absolute: 68.2 10*3/uL (ref 19.0–186.0)
Retic Ct Pct: 1.6 % (ref 0.4–3.1)

## 2020-04-19 NOTE — Telephone Encounter (Signed)
Appointments scheduled calendar printed & mailed per 8/19 los 

## 2020-04-19 NOTE — Progress Notes (Addendum)
Hematology and Oncology Follow Up Visit  Jezabel Lecker [id number] [birth year] 59 y.o. 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 Marxen is here today for follow-up. She is still having some mild fatigue at times but stays active. She is running on her treadmill and doing ab work outs. She is hoping to build more muscle.  She has nausea and bloating at times after eating. No vomiting.  She states that her LFT's have been elevated recently.  Her counts today are elevated as well. ALT 77, AST 86 and alk/phos 271. I will forward her labs to her PCP. She does state that her sister has history of fatty liver and brother also had "liver issues".   Of note, she does have history of Hep C and has been followed by Roosevelt Locks, NP with hepatology.  No fever, chills, cough, rash, dizziness, SOB, chest pain, palpitations, abdominal pain or changes in bowel or bladder habits.  No swelling, tenderness, numbness or tingling in her extremities.  No falls or syncope.  She has maintained a good diet and has added red meat back in. She admits that she needs to better hydrate throughout the day. Her weight is stable.   ECOG Performance Status: 1 - Symptomatic but completely ambulatory  Medications:  Allergies as of 04/19/2020      Reactions   Lactose Diarrhea, Nausea Only   Lisinopril Cough      Medication List    as of April 19, 2020 12:06 PM   You have not been prescribed any medications.     Allergies:  Allergies  Allergen Reactions  . Lactose Diarrhea and Nausea Only  . Lisinopril Cough    Past Medical History, Surgical history, Social history, and Family History were reviewed and updated.  Review of Systems: All other 10 point review of systems is negative.   Physical  Exam:  height is _0  (1.626 m) and weight is 160 lb 0.6 oz (72.6 kg). Her oral temperature is 98.6 F (37 C). Her blood pressure is 147/81 (abnormal) and her pulse is 59 (abnormal). Her respiration is 18 and oxygen saturation is 99%.   Wt Readings from Last 3 Encounters:  04/19/20 160 lb 0.6 oz (72.6 kg)  03/08/20 167 lb 1.9 oz (75.8 kg)  06/15/15 115 lb (52.2 kg)    Ocular: Sclerae unicteric, pupils equal, round and reactive to light Ear-nose-throat: Oropharynx clear, dentition fair Lymphatic: No cervical or supraclavicular adenopathy Lungs no rales or rhonchi, good excursion bilaterally Heart regular rate and rhythm, no murmur appreciated Abd soft, nontender, positive bowel sounds, no liver or spleen tip palpated on exam, no fluid wave  MSK no focal spinal tenderness, no joint edema Neuro: non-focal, well-oriented, appropriate affect Breasts: Deferred   Lab Results  Component Value Date   WBC 4.6 04/19/2020   HGB 11.0 (L) 04/19/2020   HCT 35.6 (L) 04/19/2020   MCV 82.8 04/19/2020   PLT 243 04/19/2020   Lab Results  Component Value Date   FERRITIN <4 (L) 03/08/2020   IRON 20 (L) 03/08/2020   TIBC 541 (H) 03/08/2020   UIBC 522 (H) 03/08/2020   IRONPCTSAT 4 (L) 03/08/2020   Lab Results  Component Value Date   RETICCTPCT 1.6 04/19/2020   RBC 4.29 04/19/2020   RBC 4.30 04/19/2020   RETICCTABS 98.0 01/23/2015  No results found for: KPAFRELGTCHN, LAMBDASER, KAPLAMBRATIO No results found for: IGGSERUM, IGA, IGMSERUM No results found for: Ronnald Ramp, A1GS, A2GS, Tillman Sers, SPEI   Chemistry      Component Value Date/Time   NA 138 04/19/2020 1125   K 3.9 04/19/2020 1125   CL 105 04/19/2020 1125   CO2 26 04/19/2020 1125   BUN 14 04/19/2020 1125   CREATININE 0.88 04/19/2020 1125      Component Value Date/Time   CALCIUM 10.0 04/19/2020 1125   ALKPHOS 271 (H) 04/19/2020 1125   AST 86 (H) 04/19/2020 1125   ALT 77 (H) 04/19/2020 1125    BILITOT 0.5 04/19/2020 1125       Impression and Plan: Ms. Sivertson is a very pleasant 59 yo African American female with a history of iron deficiency anemia secondary to malabsorption and intermittent GI blood loss with Crohn's as well as alpha thalassemia.  She had some GI upset and cramping after her IV iron infusions. I will add Pepcid to her premeds.  She has history of Hep C and her LFt's continue to go up. I forwarded her labs from today to her PCP and Roosevelt Locks, NP for further Hep C work up.  We will see what her iron studies look like and replace if needed.  She will restart her folic acid 068 mcg daily.  We will go ahead and plan to see her again in another 3 months.  She can contact our office with any questions or concerns. We can certainly see her sooner if needed.   Laverna Peace, NP 8/19/202112:06 PM

## 2020-04-20 LAB — FERRITIN: Ferritin: 344 ng/mL — ABNORMAL HIGH (ref 11–307)

## 2020-04-20 LAB — IRON AND TIBC
Iron: 68 ug/dL (ref 41–142)
Saturation Ratios: 19 % — ABNORMAL LOW (ref 21–57)
TIBC: 364 ug/dL (ref 236–444)
UIBC: 296 ug/dL (ref 120–384)

## 2020-05-29 ENCOUNTER — Other Ambulatory Visit: Payer: Self-pay | Admitting: Nurse Practitioner

## 2020-05-29 DIAGNOSIS — K7402 Hepatic fibrosis, advanced fibrosis: Secondary | ICD-10-CM

## 2020-05-29 DIAGNOSIS — R748 Abnormal levels of other serum enzymes: Secondary | ICD-10-CM

## 2020-06-15 ENCOUNTER — Ambulatory Visit
Admission: RE | Admit: 2020-06-15 | Discharge: 2020-06-15 | Disposition: A | Discharge status: Home / Self Care | Payer: No Typology Code available for payment source | Source: Ambulatory Visit | Attending: Nurse Practitioner | Admitting: Nurse Practitioner

## 2020-06-15 DIAGNOSIS — K7402 Hepatic fibrosis, advanced fibrosis: Secondary | ICD-10-CM

## 2020-06-15 DIAGNOSIS — R748 Abnormal levels of other serum enzymes: Secondary | ICD-10-CM

## 2020-06-20 ENCOUNTER — Other Ambulatory Visit: Payer: Self-pay | Admitting: Nurse Practitioner

## 2020-06-20 DIAGNOSIS — K831 Obstruction of bile duct: Secondary | ICD-10-CM

## 2020-06-20 DIAGNOSIS — K839 Disease of biliary tract, unspecified: Secondary | ICD-10-CM

## 2020-07-19 ENCOUNTER — Telehealth: Payer: Self-pay

## 2020-07-19 NOTE — Telephone Encounter (Signed)
Pt called in to r/s her 07/19/20 appt to 09/14/20.Marland KitchenMarland KitchenAOM

## 2020-07-20 ENCOUNTER — Inpatient Hospital Stay: Payer: No Typology Code available for payment source | Admitting: Family

## 2020-07-20 ENCOUNTER — Inpatient Hospital Stay: Payer: No Typology Code available for payment source

## 2020-07-21 ENCOUNTER — Other Ambulatory Visit: Payer: No Typology Code available for payment source

## 2020-08-12 ENCOUNTER — Other Ambulatory Visit: Payer: No Typology Code available for payment source

## 2020-09-12 ENCOUNTER — Telehealth: Payer: Self-pay

## 2020-09-12 ENCOUNTER — Other Ambulatory Visit: Payer: No Typology Code available for payment source

## 2020-09-12 NOTE — Telephone Encounter (Signed)
Returned pts call to cancel her 09/14/20 appts, pt states that she will c/b when she is ready to r/s     aom

## 2020-09-14 ENCOUNTER — Inpatient Hospital Stay: Payer: No Typology Code available for payment source

## 2020-09-14 ENCOUNTER — Inpatient Hospital Stay: Payer: No Typology Code available for payment source | Admitting: Family

## 2020-09-29 ENCOUNTER — Other Ambulatory Visit: Payer: No Typology Code available for payment source

## 2020-10-20 ENCOUNTER — Other Ambulatory Visit: Payer: Self-pay

## 2020-10-20 ENCOUNTER — Ambulatory Visit
Admission: RE | Admit: 2020-10-20 | Discharge: 2020-10-20 | Disposition: A | Discharge status: Home / Self Care | Payer: No Typology Code available for payment source | Source: Ambulatory Visit | Attending: Nurse Practitioner | Admitting: Nurse Practitioner

## 2020-10-20 DIAGNOSIS — K839 Disease of biliary tract, unspecified: Secondary | ICD-10-CM

## 2020-10-20 DIAGNOSIS — K831 Obstruction of bile duct: Secondary | ICD-10-CM

## 2020-10-20 MED ORDER — GADOBENATE DIMEGLUMINE 529 MG/ML IV SOLN
15.0000 mL | Freq: Once | INTRAVENOUS | Status: AC | PRN
  Administered 2020-10-20: 15 mL via INTRAVENOUS

## 2021-04-01 ENCOUNTER — Other Ambulatory Visit: Payer: Self-pay

## 2021-04-01 ENCOUNTER — Inpatient Hospital Stay (HOSPITAL_COMMUNITY): Payer: No Typology Code available for payment source | Admitting: Anesthesiology

## 2021-04-01 ENCOUNTER — Emergency Department (HOSPITAL_COMMUNITY): Payer: No Typology Code available for payment source

## 2021-04-01 ENCOUNTER — Encounter (HOSPITAL_COMMUNITY): Payer: Self-pay

## 2021-04-01 ENCOUNTER — Encounter (HOSPITAL_COMMUNITY)
Admission: EM | Disposition: A | Discharge status: Nursing Home (Includes ICF) | Payer: Self-pay | Source: Home / Self Care | Attending: Internal Medicine

## 2021-04-01 ENCOUNTER — Inpatient Hospital Stay (HOSPITAL_COMMUNITY)
Admission: EM | Admit: 2021-04-01 | Discharge: 2021-04-11 | DRG: 853 | Disposition: A | Discharge status: Other Acute Inpatient Hospital | Payer: No Typology Code available for payment source | Attending: Internal Medicine | Admitting: Internal Medicine

## 2021-04-01 ENCOUNTER — Encounter: Payer: Self-pay | Admitting: Family

## 2021-04-01 DIAGNOSIS — Z833 Family history of diabetes mellitus: Secondary | ICD-10-CM | POA: Diagnosis not present

## 2021-04-01 DIAGNOSIS — K121 Other forms of stomatitis: Secondary | ICD-10-CM | POA: Diagnosis present

## 2021-04-01 DIAGNOSIS — Z20822 Contact with and (suspected) exposure to covid-19: Secondary | ICD-10-CM | POA: Diagnosis present

## 2021-04-01 DIAGNOSIS — N7689 Other specified inflammation of vagina and vulva: Secondary | ICD-10-CM | POA: Diagnosis present

## 2021-04-01 DIAGNOSIS — K50818 Crohn's disease of both small and large intestine with other complication: Secondary | ICD-10-CM | POA: Diagnosis present

## 2021-04-01 DIAGNOSIS — I10 Essential (primary) hypertension: Secondary | ICD-10-CM | POA: Diagnosis present

## 2021-04-01 DIAGNOSIS — A419 Sepsis, unspecified organism: Principal | ICD-10-CM | POA: Diagnosis present

## 2021-04-01 DIAGNOSIS — I96 Gangrene, not elsewhere classified: Secondary | ICD-10-CM | POA: Diagnosis present

## 2021-04-01 DIAGNOSIS — N760 Acute vaginitis: Secondary | ICD-10-CM | POA: Diagnosis present

## 2021-04-01 DIAGNOSIS — K7460 Unspecified cirrhosis of liver: Secondary | ICD-10-CM | POA: Diagnosis present

## 2021-04-01 DIAGNOSIS — L02416 Cutaneous abscess of left lower limb: Secondary | ICD-10-CM | POA: Diagnosis present

## 2021-04-01 DIAGNOSIS — L02412 Cutaneous abscess of left axilla: Secondary | ICD-10-CM | POA: Diagnosis present

## 2021-04-01 DIAGNOSIS — Z820 Family history of epilepsy and other diseases of the nervous system: Secondary | ICD-10-CM | POA: Diagnosis not present

## 2021-04-01 DIAGNOSIS — E43 Unspecified severe protein-calorie malnutrition: Secondary | ICD-10-CM | POA: Diagnosis present

## 2021-04-01 DIAGNOSIS — E739 Lactose intolerance, unspecified: Secondary | ICD-10-CM | POA: Diagnosis present

## 2021-04-01 DIAGNOSIS — K644 Residual hemorrhoidal skin tags: Secondary | ICD-10-CM | POA: Diagnosis present

## 2021-04-01 DIAGNOSIS — D72829 Elevated white blood cell count, unspecified: Secondary | ICD-10-CM | POA: Diagnosis present

## 2021-04-01 DIAGNOSIS — B182 Chronic viral hepatitis C: Secondary | ICD-10-CM | POA: Diagnosis present

## 2021-04-01 DIAGNOSIS — L02811 Cutaneous abscess of head [any part, except face]: Secondary | ICD-10-CM | POA: Diagnosis present

## 2021-04-01 DIAGNOSIS — D649 Anemia, unspecified: Secondary | ICD-10-CM | POA: Diagnosis present

## 2021-04-01 DIAGNOSIS — Z6827 Body mass index (BMI) 27.0-27.9, adult: Secondary | ICD-10-CM

## 2021-04-01 DIAGNOSIS — N179 Acute kidney failure, unspecified: Secondary | ICD-10-CM | POA: Diagnosis present

## 2021-04-01 DIAGNOSIS — N493 Fournier gangrene: Secondary | ICD-10-CM | POA: Diagnosis present

## 2021-04-01 DIAGNOSIS — N7682 Fournier disease of vagina and vulva: Secondary | ICD-10-CM

## 2021-04-01 DIAGNOSIS — K50819 Crohn's disease of both small and large intestine with unspecified complications: Secondary | ICD-10-CM | POA: Diagnosis present

## 2021-04-01 DIAGNOSIS — R739 Hyperglycemia, unspecified: Secondary | ICD-10-CM | POA: Diagnosis present

## 2021-04-01 DIAGNOSIS — D500 Iron deficiency anemia secondary to blood loss (chronic): Secondary | ICD-10-CM | POA: Diagnosis not present

## 2021-04-01 DIAGNOSIS — E8809 Other disorders of plasma-protein metabolism, not elsewhere classified: Secondary | ICD-10-CM | POA: Diagnosis present

## 2021-04-01 HISTORY — PX: RECTAL EXAM UNDER ANESTHESIA: SHX6399

## 2021-04-01 LAB — COMPREHENSIVE METABOLIC PANEL
ALT: 27 U/L (ref 0–44)
AST: 34 U/L (ref 15–41)
Albumin: 2.1 g/dL — ABNORMAL LOW (ref 3.5–5.0)
Alkaline Phosphatase: 145 U/L — ABNORMAL HIGH (ref 38–126)
Anion gap: 11 (ref 5–15)
BUN: 29 mg/dL — ABNORMAL HIGH (ref 6–20)
CO2: 21 mmol/L — ABNORMAL LOW (ref 22–32)
Calcium: 9 mg/dL (ref 8.9–10.3)
Chloride: 103 mmol/L (ref 98–111)
Creatinine, Ser: 1.43 mg/dL — ABNORMAL HIGH (ref 0.44–1.00)
GFR, Estimated: 43 mL/min — ABNORMAL LOW (ref >60)
Glucose, Bld: 133 mg/dL — ABNORMAL HIGH (ref 70–99)
Potassium: 3.5 mmol/L (ref 3.5–5.1)
Sodium: 135 mmol/L (ref 135–145)
Total Bilirubin: 1.3 mg/dL — ABNORMAL HIGH (ref 0.3–1.2)
Total Protein: 6.8 g/dL (ref 6.5–8.1)

## 2021-04-01 LAB — RESP PANEL BY RT-PCR (FLU A&B, COVID) ARPGX2
Influenza A by PCR: NEGATIVE
Influenza B by PCR: NEGATIVE
SARS Coronavirus 2 by RT PCR: NEGATIVE

## 2021-04-01 LAB — CBC WITH DIFFERENTIAL/PLATELET
Abs Immature Granulocytes: 0 10*3/uL (ref 0.00–0.07)
Basophils Absolute: 0 10*3/uL (ref 0.0–0.1)
Basophils Relative: 0 %
Eosinophils Absolute: 0 10*3/uL (ref 0.0–0.5)
Eosinophils Relative: 0 %
HCT: 35.9 % — ABNORMAL LOW (ref 36.0–46.0)
Hemoglobin: 11.9 g/dL — ABNORMAL LOW (ref 12.0–15.0)
Lymphocytes Relative: 5 %
Lymphs Abs: 1.1 10*3/uL (ref 0.7–4.0)
MCH: 28.5 pg (ref 26.0–34.0)
MCHC: 33.1 g/dL (ref 30.0–36.0)
MCV: 85.9 fL (ref 80.0–100.0)
Monocytes Absolute: 0.4 10*3/uL (ref 0.1–1.0)
Monocytes Relative: 2 %
Neutro Abs: 19.9 10*3/uL — ABNORMAL HIGH (ref 1.7–7.7)
Neutrophils Relative %: 93 %
Platelets: 248 10*3/uL (ref 150–400)
RBC: 4.18 MIL/uL (ref 3.87–5.11)
RDW: 12.5 % (ref 11.5–15.5)
WBC: 21.4 10*3/uL — ABNORMAL HIGH (ref 4.0–10.5)
nRBC: 0 % (ref 0.0–0.2)

## 2021-04-01 LAB — TYPE AND SCREEN
ABO/RH(D): B POS
Antibody Screen: NEGATIVE

## 2021-04-01 LAB — POC OCCULT BLOOD, ED: Fecal Occult Bld: POSITIVE — AB

## 2021-04-01 SURGERY — EXAM UNDER ANESTHESIA, RECTUM
Anesthesia: General | Site: Perineum

## 2021-04-01 MED ORDER — SUGAMMADEX SODIUM 200 MG/2ML IV SOLN
INTRAVENOUS | Status: DC | PRN
  Administered 2021-04-01: 200 mg via INTRAVENOUS

## 2021-04-01 MED ORDER — FENTANYL CITRATE (PF) 100 MCG/2ML IJ SOLN: 25.0000 ug | INTRAMUSCULAR | Status: DC | PRN

## 2021-04-01 MED ORDER — HYDROMORPHONE HCL 1 MG/ML IJ SOLN
0.5000 mg | INTRAMUSCULAR | Status: DC | PRN
  Administered 2021-04-01 – 2021-04-02 (×2): 1 mg via INTRAVENOUS
  Administered 2021-04-03: 0.5 mg via INTRAVENOUS
  Administered 2021-04-03: 1 mg via INTRAVENOUS
  Administered 2021-04-04: 0.5 mg via INTRAVENOUS
  Administered 2021-04-05: 1 mg via INTRAVENOUS
  Administered 2021-04-05: 0.5 mg via INTRAVENOUS
  Administered 2021-04-06 – 2021-04-11 (×18): 1 mg via INTRAVENOUS
  Filled 2021-04-01 (×26): qty 1

## 2021-04-01 MED ORDER — MIDAZOLAM HCL 2 MG/2ML IJ SOLN
INTRAMUSCULAR | Status: AC
  Filled 2021-04-01: qty 2

## 2021-04-01 MED ORDER — CLINDAMYCIN PHOSPHATE 900 MG/50ML IV SOLN
900.0000 mg | Freq: Once | INTRAVENOUS | Status: DC
  Filled 2021-04-01: qty 50

## 2021-04-01 MED ORDER — PROPOFOL 10 MG/ML IV BOLUS
INTRAVENOUS | Status: DC | PRN
  Administered 2021-04-01: 130 mg via INTRAVENOUS

## 2021-04-01 MED ORDER — LINEZOLID 600 MG/300ML IV SOLN
600.0000 mg | Freq: Two times a day (BID) | INTRAVENOUS | Status: DC
  Administered 2021-04-01 – 2021-04-09 (×16): 600 mg via INTRAVENOUS
  Filled 2021-04-01 (×18): qty 300

## 2021-04-01 MED ORDER — IOHEXOL 300 MG/ML  SOLN
80.0000 mL | Freq: Once | INTRAMUSCULAR | Status: AC | PRN
  Administered 2021-04-01: 80 mL via INTRAVENOUS

## 2021-04-01 MED ORDER — METOPROLOL TARTRATE 5 MG/5ML IV SOLN: 5.0000 mg | Freq: Four times a day (QID) | INTRAVENOUS | Status: DC | PRN

## 2021-04-01 MED ORDER — PIPERACILLIN-TAZOBACTAM 3.375 G IVPB 30 MIN: 3.3750 g | Freq: Three times a day (TID) | INTRAVENOUS | Status: DC

## 2021-04-01 MED ORDER — DEXAMETHASONE SODIUM PHOSPHATE 10 MG/ML IJ SOLN
INTRAMUSCULAR | Status: DC | PRN
Start: 2021-04-01 — End: 2021-04-01
  Administered 2021-04-01: 5 mg via INTRAVENOUS

## 2021-04-01 MED ORDER — DEXMEDETOMIDINE HCL IN NACL 200 MCG/50ML IV SOLN
INTRAVENOUS | Status: AC
  Filled 2021-04-01: qty 50

## 2021-04-01 MED ORDER — ROCURONIUM BROMIDE 10 MG/ML (PF) SYRINGE
PREFILLED_SYRINGE | INTRAVENOUS | Status: DC | PRN
Start: 2021-04-01 — End: 2021-04-01
  Administered 2021-04-01: 50 mg via INTRAVENOUS

## 2021-04-01 MED ORDER — ROCURONIUM BROMIDE 10 MG/ML (PF) SYRINGE
PREFILLED_SYRINGE | INTRAVENOUS | Status: AC
  Filled 2021-04-01: qty 10

## 2021-04-01 MED ORDER — ACETAMINOPHEN 325 MG PO TABS: 650.0000 mg | ORAL_TABLET | Freq: Four times a day (QID) | ORAL | Status: DC | PRN

## 2021-04-01 MED ORDER — ONDANSETRON HCL 4 MG/2ML IJ SOLN
4.0000 mg | Freq: Four times a day (QID) | INTRAMUSCULAR | Status: DC | PRN
  Administered 2021-04-04 – 2021-04-09 (×5): 4 mg via INTRAVENOUS
  Filled 2021-04-01 (×5): qty 2

## 2021-04-01 MED ORDER — STERILE WATER FOR IRRIGATION IR SOLN
Status: DC | PRN
  Administered 2021-04-01: 1000 mL

## 2021-04-01 MED ORDER — OXYCODONE HCL 5 MG PO TABS
5.0000 mg | ORAL_TABLET | ORAL | Status: DC | PRN
  Administered 2021-04-03: 5 mg via ORAL
  Filled 2021-04-01: qty 1

## 2021-04-01 MED ORDER — ONDANSETRON HCL 4 MG/2ML IJ SOLN
INTRAMUSCULAR | Status: AC
  Filled 2021-04-01: qty 2

## 2021-04-01 MED ORDER — 0.9 % SODIUM CHLORIDE (POUR BTL) OPTIME
TOPICAL | Status: DC | PRN
  Administered 2021-04-01: 1000 mL

## 2021-04-01 MED ORDER — FENTANYL CITRATE (PF) 250 MCG/5ML IJ SOLN
INTRAMUSCULAR | Status: DC | PRN
  Administered 2021-04-01: 50 ug via INTRAVENOUS
  Administered 2021-04-01 (×2): 100 ug via INTRAVENOUS

## 2021-04-01 MED ORDER — SCOPOLAMINE 1 MG/3DAYS TD PT72
MEDICATED_PATCH | TRANSDERMAL | Status: AC
  Administered 2021-04-01: 1.5 mg via TRANSDERMAL
  Filled 2021-04-01: qty 1

## 2021-04-01 MED ORDER — DOCUSATE SODIUM 100 MG PO CAPS
100.0000 mg | ORAL_CAPSULE | Freq: Two times a day (BID) | ORAL | Status: DC
  Administered 2021-04-01 – 2021-04-02 (×2): 100 mg via ORAL
  Filled 2021-04-01 (×2): qty 1

## 2021-04-01 MED ORDER — FENTANYL CITRATE (PF) 100 MCG/2ML IJ SOLN
50.0000 ug | Freq: Once | INTRAMUSCULAR | Status: AC
  Administered 2021-04-01: 50 ug via INTRAVENOUS
  Filled 2021-04-01: qty 2

## 2021-04-01 MED ORDER — LACTATED RINGERS IV SOLN: INTRAVENOUS | Status: DC

## 2021-04-01 MED ORDER — DEXMEDETOMIDINE (PRECEDEX) IN NS 20 MCG/5ML (4 MCG/ML) IV SYRINGE
PREFILLED_SYRINGE | INTRAVENOUS | Status: DC | PRN
  Administered 2021-04-01 (×2): 8 ug via INTRAVENOUS

## 2021-04-01 MED ORDER — DEXAMETHASONE SODIUM PHOSPHATE 10 MG/ML IJ SOLN
INTRAMUSCULAR | Status: AC
  Filled 2021-04-01: qty 1

## 2021-04-01 MED ORDER — MORPHINE SULFATE (PF) 4 MG/ML IV SOLN
4.0000 mg | Freq: Once | INTRAVENOUS | Status: AC
Start: 2021-04-01 — End: 2021-04-01
  Administered 2021-04-01: 4 mg via INTRAVENOUS
  Filled 2021-04-01: qty 1

## 2021-04-01 MED ORDER — ORAL CARE MOUTH RINSE: 15.0000 mL | Freq: Once | OROMUCOSAL | Status: DC

## 2021-04-01 MED ORDER — HYDROMORPHONE HCL 1 MG/ML IJ SOLN: 0.5000 mg | INTRAMUSCULAR | Status: DC | PRN

## 2021-04-01 MED ORDER — POLYETHYLENE GLYCOL 3350 17 G PO PACK: 17.0000 g | PACK | Freq: Every day | ORAL | Status: DC | PRN

## 2021-04-01 MED ORDER — HYDROMORPHONE HCL 1 MG/ML IJ SOLN
INTRAMUSCULAR | Status: AC
  Filled 2021-04-01: qty 0.5

## 2021-04-01 MED ORDER — MIDAZOLAM HCL 2 MG/2ML IJ SOLN
INTRAMUSCULAR | Status: DC | PRN
  Administered 2021-04-01: 2 mg via INTRAVENOUS

## 2021-04-01 MED ORDER — LIDOCAINE 2% (20 MG/ML) 5 ML SYRINGE
INTRAMUSCULAR | Status: DC | PRN
  Administered 2021-04-01: 60 mg via INTRAVENOUS

## 2021-04-01 MED ORDER — SUGAMMADEX SODIUM 500 MG/5ML IV SOLN
INTRAVENOUS | Status: AC
  Filled 2021-04-01: qty 5

## 2021-04-01 MED ORDER — LIDOCAINE 2% (20 MG/ML) 5 ML SYRINGE
INTRAMUSCULAR | Status: AC
  Filled 2021-04-01: qty 5

## 2021-04-01 MED ORDER — CHLORHEXIDINE GLUCONATE 0.12 % MT SOLN: 15.0000 mL | Freq: Once | OROMUCOSAL | Status: DC

## 2021-04-01 MED ORDER — PIPERACILLIN-TAZOBACTAM 3.375 G IVPB: 3.3750 g | Freq: Three times a day (TID) | INTRAVENOUS | Status: DC

## 2021-04-01 MED ORDER — ONDANSETRON HCL 4 MG/2ML IJ SOLN
INTRAMUSCULAR | Status: DC | PRN
  Administered 2021-04-01: 4 mg via INTRAVENOUS

## 2021-04-01 MED ORDER — PROMETHAZINE HCL 25 MG/ML IJ SOLN: 6.2500 mg | INTRAMUSCULAR | Status: DC | PRN

## 2021-04-01 MED ORDER — ACETAMINOPHEN 650 MG RE SUPP: 650.0000 mg | Freq: Four times a day (QID) | RECTAL | Status: DC | PRN

## 2021-04-01 MED ORDER — SODIUM CHLORIDE 0.9 % IV BOLUS
1000.0000 mL | Freq: Once | INTRAVENOUS | Status: AC
Start: 2021-04-01 — End: 2021-04-01
  Administered 2021-04-01: 1000 mL via INTRAVENOUS

## 2021-04-01 MED ORDER — ACETAMINOPHEN 10 MG/ML IV SOLN: 1000.0000 mg | Freq: Once | INTRAVENOUS | Status: DC | PRN

## 2021-04-01 MED ORDER — HYDROMORPHONE HCL 1 MG/ML IJ SOLN
INTRAMUSCULAR | Status: DC | PRN
  Administered 2021-04-01 (×2): .25 mg via INTRAVENOUS

## 2021-04-01 MED ORDER — FENTANYL CITRATE (PF) 250 MCG/5ML IJ SOLN
INTRAMUSCULAR | Status: AC
  Filled 2021-04-01: qty 5

## 2021-04-01 MED ORDER — BUPIVACAINE-EPINEPHRINE (PF) 0.25% -1:200000 IJ SOLN
INTRAMUSCULAR | Status: AC
  Filled 2021-04-01: qty 30

## 2021-04-01 MED ORDER — PIPERACILLIN-TAZOBACTAM 3.375 G IVPB
3.3750 g | Freq: Three times a day (TID) | INTRAVENOUS | Status: DC
  Administered 2021-04-01 – 2021-04-09 (×24): 3.375 g via INTRAVENOUS
  Filled 2021-04-01 (×24): qty 50

## 2021-04-01 MED ORDER — OXYCODONE HCL 5 MG PO TABS
10.0000 mg | ORAL_TABLET | ORAL | Status: DC | PRN
  Administered 2021-04-02 – 2021-04-11 (×19): 10 mg via ORAL
  Filled 2021-04-01 (×21): qty 2

## 2021-04-01 MED ORDER — SCOPOLAMINE 1 MG/3DAYS TD PT72: 1.0000 | MEDICATED_PATCH | Freq: Once | TRANSDERMAL | Status: DC

## 2021-04-01 MED ORDER — ENOXAPARIN SODIUM 40 MG/0.4ML IJ SOSY
40.0000 mg | PREFILLED_SYRINGE | INTRAMUSCULAR | Status: DC
  Administered 2021-04-02 – 2021-04-10 (×9): 40 mg via SUBCUTANEOUS
  Filled 2021-04-01 (×9): qty 0.4

## 2021-04-01 SURGICAL SUPPLY — 35 items
BAG COUNTER SPONGE SURGICOUNT (BAG) IMPLANT
BLADE SURG 15 STRL LF DISP TIS (BLADE) ×1 IMPLANT
BLADE SURG 15 STRL SS (BLADE) ×2
BNDG GAUZE ELAST 4 BULKY (GAUZE/BANDAGES/DRESSINGS) ×4 IMPLANT
BRIEF STRETCH FOR OB PAD LRG (UNDERPADS AND DIAPERS) ×2 IMPLANT
CANISTER SUCT 3000ML PPV (MISCELLANEOUS) ×2 IMPLANT
COVER SURGICAL LIGHT HANDLE (MISCELLANEOUS) ×2 IMPLANT
DRSG PAD ABDOMINAL 8X10 ST (GAUZE/BANDAGES/DRESSINGS) IMPLANT
ELECT CAUTERY BLADE 6.4 (BLADE) ×2 IMPLANT
GAUZE SPONGE 4X4 12PLY STRL (GAUZE/BANDAGES/DRESSINGS) IMPLANT
GAUZE SPONGE 4X4 12PLY STRL LF (GAUZE/BANDAGES/DRESSINGS) ×2 IMPLANT
GLOVE SRG 8 PF TXTR STRL LF DI (GLOVE) ×2 IMPLANT
GLOVE SURG ENC MOIS LTX SZ6 (GLOVE) ×2 IMPLANT
GLOVE SURG UNDER LTX SZ6.5 (GLOVE) ×2 IMPLANT
GLOVE SURG UNDER POLY LF SZ8 (GLOVE) ×4
GOWN STRL REUS W/ TWL LRG LVL3 (GOWN DISPOSABLE) ×2 IMPLANT
GOWN STRL REUS W/TWL LRG LVL3 (GOWN DISPOSABLE) ×4
KIT BASIN OR (CUSTOM PROCEDURE TRAY) ×2 IMPLANT
KIT TURNOVER KIT B (KITS) ×2 IMPLANT
NEEDLE HYPO 25GX1X1/2 BEV (NEEDLE) IMPLANT
NS IRRIG 1000ML POUR BTL (IV SOLUTION) ×2 IMPLANT
PACK LITHOTOMY IV (CUSTOM PROCEDURE TRAY) ×2 IMPLANT
PAD ABD 7.5X8 STRL (GAUZE/BANDAGES/DRESSINGS) ×2 IMPLANT
PAD ARMBOARD 7.5X6 YLW CONV (MISCELLANEOUS) ×2 IMPLANT
PENCIL BUTTON HOLSTER BLD 10FT (ELECTRODE) ×2 IMPLANT
SOL PREP POV-IOD 4OZ 10% (MISCELLANEOUS) ×2 IMPLANT
SPONGE T-LAP 18X18 ~~LOC~~+RFID (SPONGE) ×6 IMPLANT
SPONGE T-LAP 4X18 ~~LOC~~+RFID (SPONGE) ×2 IMPLANT
SURGILUBE 2OZ TUBE FLIPTOP (MISCELLANEOUS) ×2 IMPLANT
SUT CHROMIC 3 0 SH 27 (SUTURE) IMPLANT
SYR CONTROL 10ML LL (SYRINGE) IMPLANT
TOWEL GREEN STERILE (TOWEL DISPOSABLE) ×2 IMPLANT
TRAY FOLEY W/BAG SLVR 14FR (SET/KITS/TRAYS/PACK) ×2 IMPLANT
TUBE CONNECTING 12X1/4 (SUCTIONS) ×2 IMPLANT
YANKAUER SUCT BULB TIP NO VENT (SUCTIONS) ×2 IMPLANT

## 2021-04-01 NOTE — ED Provider Notes (Signed)
[facility]PART[facility] Provider Note   CSN: [id number] Arrival date & time: 04/01/21  1039     History Chief Complaint  Patient presents with   Abscess    Abigail Beck is a 60 y.o. female with a past medical history of Crohn's disease, hemorrhoids, hypertension presenting to the ED with multiple complaints. Her first complaint is abscesses in multiple parts of her body.  States that she noticed a vaginal abscess initially which spontaneously ruptured.  This first occurred on July 12. Since then she has noticed abscesses on her left axilla as well as her scalp.  She was placed on doxycycline last week and is continue to take this but is concerned due to increase in purulent material.  Denies any fevers or chills. Also concerned about rectal bleeding.  She is concerned that this is coming from her hemorrhoids and "I am losing a lot of blood."  She is scheduled for colonoscopy next week but is short of breath and would like to make sure that she is not anemic.  Denies any abdominal pain but does report bloody stools.  No vomiting, nausea, chest pain.  HPI     Past Medical History:  Diagnosis Date   Crohn's disease of both small and large intestine with complication ([place]) 123456   Fibroids    Hay fever    Hemorrhoids 03/29/15   some bleeding today   Hepatitis C ~2011/2012   Incidental with physical; unclear etiology   Hypertension    Situational, diet & exercise controlled   PONV (postoperative nausea and vomiting)    Spider bite 01/01/15   treated x 2 months left lower leg ? possible brown recluse     Patient Active Problem List   Diagnosis Date Noted   IDA (iron deficiency anemia) 03/08/2020   Crohn's disease of both small and large intestine with complication ([place]) 123456   Protein-calorie malnutrition, severe ([place]) 04/16/2015   Diarrhea 04/15/2015   Hemorrhoids 04/15/2015   Thrombocytosis 04/15/2015   Alpha thalassemia ([place])  04/15/2015   Chronic hepatitis C ([place]) 04/15/2015   Abdominal pain, generalized    Hyponatremia    Hepatitis C virus carrier state ([place])    Acute blood loss anemia 05/15/2013   Microcytic anemia 05/15/2013    Past Surgical History:  Procedure Laterality Date   TUBAL LIGATION       OB History   No obstetric history on file.     Family History  Problem Relation Age of Onset   Alzheimer's disease Mother    Diabetes Mother    Glucose-6-phos deficiency Son    Healthy Father        55 yo without medical problems   Healthy Paternal Grandmother        1 yo without medical problems   Liver disease Sister        ?Fatty liver disease    Social History   Tobacco Use   Smoking status: Never   Smokeless tobacco: Never   Tobacco comments:    NEVER USED TOBACCO  Vaping Use   Vaping Use: Never used  Substance Use Topics   Alcohol use: No    Alcohol/week: 0.0 standard drinks   Drug use: No    Home Medications Prior to Admission medications   Medication Sig Start Date End Date Taking? Authorizing Provider  doxycycline (ADOXA) 100 MG tablet Take 100 mg by mouth 2 (two) times daily. Take 1  by mouth twice daily 03/27/21  Yes [provider]  ibuprofen (ADVIL) 200 MG tablet Take 200 mg by mouth every 6 (six) hours as needed.   Yes [provider]  predniSONE (DELTASONE) 10 MG tablet Take 1 tablet by mouth. Take 1 by mouth once daily Patient not taking: Reported on 04/01/2021 03/14/21   [provider]    Allergies    Lactose, Lisinopril, and Penicillins  Review of Systems   Review of Systems  Constitutional:  Negative for appetite change, chills and fever.  HENT:  Negative for ear pain, rhinorrhea, sneezing and sore throat.   Eyes:  Negative for photophobia and visual disturbance.  Respiratory:  Positive for shortness of breath. Negative for cough, chest tightness and wheezing.   Cardiovascular:  Negative for chest pain and palpitations.   Gastrointestinal:  Positive for anal bleeding. Negative for abdominal pain, blood in stool, constipation, diarrhea, nausea and vomiting.  Genitourinary:  Negative for dysuria, hematuria and urgency.  Musculoskeletal:  Negative for myalgias.  Skin:  Positive for wound. Negative for rash.  Neurological:  Negative for dizziness, weakness and light-headedness.   Physical Exam Updated Vital Signs BP (!) 143/87   Pulse 95   Temp 98.8 F (37.1 C) (Oral)   Resp 16   Ht '5\' 4"'$  (1.626 m)   Wt 72.6 kg   SpO2 99%   BMI 27.47 kg/m   Physical Exam Vitals and nursing note reviewed. Exam conducted with a chaperone present.  Constitutional:      General: She is not in acute distress.    Appearance: She is well-developed.  HENT:     Head: Normocephalic and atraumatic.     Nose: Nose normal.  Eyes:     General: No scleral icterus.       Left eye: No discharge.     Conjunctiva/sclera: Conjunctivae normal.  Cardiovascular:     Rate and Rhythm: Regular rhythm. Tachycardia present.     Heart sounds: Normal heart sounds. No murmur heard.   No friction rub. No gallop.  Pulmonary:     Effort: Pulmonary effort is normal. No respiratory distress.     Breath sounds: Normal breath sounds.  Abdominal:     General: Bowel sounds are normal. There is no distension.     Palpations: Abdomen is soft.     Tenderness: There is no abdominal tenderness. There is no guarding.  Musculoskeletal:        General: Normal range of motion.     Cervical back: Normal range of motion and neck supple.  Skin:    General: Skin is warm and dry.     Findings: No rash.     Comments: Copious amount of purulent drainage and scarring noted in groin area.  No areas of induration or fluctuance.  This is extremely foul-smelling.  No obvious hemorrhoids although difficult to fully examine due to her discomfort and the amount of purulent drainage and scarring.  No masses noted on DRE.  Neurological:     Mental Status: She is  alert.     Motor: No abnormal muscle tone.     Coordination: Coordination normal.    ED Results / Procedures / Treatments   Labs (all labs ordered are listed, but only abnormal results are displayed) Labs Reviewed  COMPREHENSIVE METABOLIC PANEL - Abnormal; Notable for the following components:      Result Value   CO2 21 (*)    Glucose, Bld 133 (*)    BUN 29 (*)    Creatinine, Ser 1.43 (*)  Albumin 2.1 (*)    Alkaline Phosphatase 145 (*)    Total Bilirubin 1.3 (*)    GFR, Estimated 43 (*)    All other components within normal limits  CBC WITH DIFFERENTIAL/PLATELET - Abnormal; Notable for the following components:   WBC 21.4 (*)    Hemoglobin 11.9 (*)    HCT 35.9 (*)    Neutro Abs 19.9 (*)    All other components within normal limits  POC OCCULT BLOOD, ED - Abnormal; Notable for the following components:   Fecal Occult Bld POSITIVE (*)    All other components within normal limits  RESP PANEL BY RT-PCR (FLU A&B, COVID) ARPGX2  TYPE AND SCREEN    EKG None  Radiology CT ABDOMEN PELVIS W CONTRAST  Result Date: 04/01/2021 CLINICAL DATA:  Anemia, bleeding hemorrhoids, foul-smelling vaginal odor EXAM: CT ABDOMEN AND PELVIS WITH CONTRAST TECHNIQUE: Multidetector CT imaging of the abdomen and pelvis was performed using the standard protocol following bolus administration of intravenous contrast. CONTRAST:  71m OMNIPAQUE IOHEXOL 300 MG/ML  SOLN COMPARISON:  04/15/2015, 10/20/2020 FINDINGS: Lower chest: No acute pleural or parenchymal lung disease. Hepatobiliary: Stable heterogeneity and capsular retraction along the superior aspect right lobe liver, likely related to patient's known history of cirrhosis. No change in appearance since prior MRI. No intrahepatic duct dilation. Continued distention of the gallbladder without evidence of cholelithiasis or cholecystitis. Pancreas: Unremarkable. No pancreatic ductal dilatation or surrounding inflammatory changes. Pancreatic divisum again  incidentally noted. Spleen: Normal in size without focal abnormality. Adrenals/Urinary Tract: Stable right renal cyst. Otherwise the kidneys enhance normally and symmetrically. No urinary tract calculi or obstructive uropathy. The adrenals and bladder are grossly unremarkable. Stomach/Bowel: No bowel obstruction or ileus. Mild mural thickening of the cecum and proximal ascending colon may reflect inflammatory or infectious colitis. Normal appendix right lower quadrant. Vascular/Lymphatic: The borderline enlarged reactive lymph nodes seen within the upper abdomen on prior MRI and CT are not appreciably changed. No pathologic adenopathy. No significant vascular findings. Reproductive: Heterogeneous uterus consistent with multiple fibroids unchanged. There are no adnexal masses. There is subcutaneous gas within the bilateral labia, with subcu gas and inflammatory changes extending to the perineum. Findings are consistent with Fournier gangrene. No localized fluid collection or abscess. Other: No free intraperitoneal fluid or free gas. No abdominal wall hernia. Musculoskeletal: No acute or destructive bony lesions. Reconstructed images demonstrate no additional findings. IMPRESSION: 1. 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 2. Mild wall thickening of the proximal colon, consistent with inflammatory bowel disease in a patient with a history of Crohn disease. 3. Stable heterogeneity and capsular retraction of the right lobe liver, unchanged since MRI and most consistent with history of cirrhosis. 4. Fibroid uterus. Critical Value/emergent results were called by telephone at the time of interpretation on 04/01/2021 at 5:28 pm [facility] who verbally acknowledged these results. Electronically Signed   By: MRanda NgoM.D.   On: 04/01/2021 17:42    Procedures .Critical Care  Date/Time: 04/01/2021 5:57  PM Performed by: KDelia Heady PA-C Authorized by: KDelia Heady PA-C   Critical care provider statement:    Critical care time (minutes):  45   Critical care time was exclusive of:  Separately billable procedures and treating other patients and teaching time   Critical care was necessary to treat or prevent imminent or life-threatening deterioration of the following conditions:  Sepsis and circulatory failure   Critical care  was time spent personally by me on the following activities:  Development of treatment plan with patient or surrogate, discussions with consultants, evaluation of patient's response to treatment, obtaining history from patient or surrogate, examination of patient, ordering and performing treatments and interventions, ordering and review of laboratory studies, ordering and review of radiographic studies, re-evaluation of patient's condition and review of old charts   I assumed direction of critical care for this patient from another provider in my specialty: no     Medications Ordered in ED Medications  linezolid (ZYVOX) IVPB 600 mg (has no administration in time range)  piperacillin-tazobactam (ZOSYN) IVPB 3.375 g (3.375 g Intravenous New Bag/Given 04/01/21 1753)  fentaNYL (SUBLIMAZE) injection 50 mcg (50 mcg Intravenous Given 04/01/21 1334)  sodium chloride 0.9 % bolus 1,000 mL (1,000 mLs Intravenous New Bag/Given 04/01/21 1333)  morphine 4 MG/ML injection 4 mg (4 mg Intravenous Given 04/01/21 1554)  iohexol (OMNIPAQUE) 300 MG/ML solution 80 mL (80 mLs Intravenous Contrast Given 04/01/21 1648)    ED Course  I have reviewed the triage vital signs and the nursing notes.  Pertinent labs & imaging results that were available during my care of the patient were reviewed by me and considered in my medical decision making (see chart for details).  Clinical Course as of 04/01/21 1814  Mon Apr 01, 2021  1425 Hemoglobin(!): 11.9 [HK]  1425 WBC(!): 21.4 [HK]  1425 Fecal Occult Blood,  POC(!): POSITIVE [HK]    Clinical Course User Index [HK] Delia Heady, PA-C   MDM Rules/Calculators/A&P                           60 year old female with past medical history of Crohn's disease, hemorrhoids, hypertension presenting to the ED with multiple complaints.,  Concerned about abscesses to multiple parts of her body.  States that she had a vaginal abscess about 2 to 3 weeks ago that she noticed which has spontaneously ruptured.  She has subsequently noticed abscesses in her scalp and left axilla as well.  She is concerned about a large amount of purulent material in her groin area.  She has been on doxycycline for the past week.  She is also concerned about rectal bleeding which could be from her hemorrhoids.  She would like to make sure she is not anemic.  On exam patient with complaints about of drainage noted in groin area with scarring.  No areas of fluctuance.  DRE without any tenderness or obvious hemorrhoid.  Hemoccult is positive but this could be due to an sternal source.  She has a leukocytosis of 21.4.  Her hemoglobin is 11.9 here.  Creatinine slightly elevated and was given IV fluids.  CT scan of the abdomen pelvis shows findings consistent with Fournier's gangrene.  Also concern for possible Crohn's flare.  I have started her on broad-spectrum antibiotics and will consult general surgery. Dr. Kae Heller, general surgeon will see patient at the bedside.   Portions of this note were generated with Lobbyist. Dictation errors may occur despite best attempts at proofreading.  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

## 2021-04-01 NOTE — Progress Notes (Signed)
Pharmacy Antibiotic Note  Abigail Beck is a 60 y.o. female admitted on 04/01/2021 with  fournier's gangrene .  Pharmacy has been consulted for zosyn dosing.  Plan: Zosyn 3.375g IV q8h (4 hour infusion). Linezolid '600mg'$  IV q12h -Monitor renal function, clinical status, and antibiotic plan  Height: '5\' 4"'$  (162.6 cm) Weight: 72.6 kg (160 lb 0.9 oz) IBW/kg (Calculated) : 54.7  Temp (24hrs), Avg:98.8 F (37.1 C), Min:98.8 F (37.1 C), Max:98.8 F (37.1 C)  Recent Labs  Lab 04/01/21 1331  WBC 21.4*  CREATININE 1.43*    Estimated Creatinine Clearance: 41.9 mL/min (A) (by C-G formula based on SCr of 1.43 mg/dL (H)).    Allergies  Allergen Reactions   Lactose Diarrhea and Nausea Only   Lisinopril Cough   Penicillins Nausea And Vomiting   Antimicrobials this admission: Linezolid 8/1 >>  Zosyn 8/1 >>   Dose adjustments this admission: N/A  Thank you for allowing pharmacy to be a part of this patient's care.  Joetta Manners, PharmD, [facility] Clinical Pharmacist ED RPh Phone: [place]: [phone]

## 2021-04-01 NOTE — ED Notes (Signed)
Pt taken to OR via stretcher.  Family present with pt belongings

## 2021-04-01 NOTE — Anesthesia Preprocedure Evaluation (Signed)
Anesthesia Evaluation  Patient identified by MRN, date of birth, ID band Patient awake    Reviewed: Allergy & Precautions, NPO status , Patient's Chart, lab work & pertinent test results  History of Anesthesia Complications (+) PONV  Airway Mallampati: II  TM Distance: >3 FB Neck ROM: Full    Dental  (+) Teeth Intact   Pulmonary neg pulmonary ROS,    Pulmonary exam normal        Cardiovascular hypertension, Pt. on medications + DVT   Rhythm:Regular Rate:Normal     Neuro/Psych negative neurological ROS  negative psych ROS   GI/Hepatic (+) Hepatitis -, CCrohn's   Endo/Other  negative endocrine ROS  Renal/GU negative Renal ROS  negative genitourinary   Musculoskeletal Fournier's gangrene   Abdominal (+)  Abdomen: soft. Bowel sounds: normal.  Peds  Hematology  (+) anemia ,   Anesthesia Other Findings   Reproductive/Obstetrics                            Anesthesia Physical Anesthesia Plan  ASA: 2  Anesthesia Plan: General   Post-op Pain Management:    Induction: Intravenous  PONV Risk Score and Plan: 4 or greater and Ondansetron, Dexamethasone, Scopolamine patch - Pre-op, Midazolam and Treatment may vary due to age or medical condition  Airway Management Planned: Mask and Oral ETT  Additional Equipment: None  Intra-op Plan:   Post-operative Plan: Extubation in OR  Informed Consent: I have reviewed the patients History and Physical, chart, labs and discussed the procedure including the risks, benefits and alternatives for the proposed anesthesia with the patient or authorized representative who has indicated his/her understanding and acceptance.     Dental advisory given  Plan Discussed with: CRNA  Anesthesia Plan Comments: (Lab Results      Component                Value               Date                      WBC                      21.4 (H)            04/01/2021                 HGB                      11.9 (L)            04/01/2021                HCT                      35.9 (L)            04/01/2021                MCV                      85.9                04/01/2021                PLT                      248  04/01/2021           Lab Results      Component                Value               Date                      NA                       135                 04/01/2021                K                        3.5                 04/01/2021                CO2                      21 (L)              04/01/2021                GLUCOSE                  133 (H)             04/01/2021                BUN                      29 (H)              04/01/2021                CREATININE               1.43 (H)            04/01/2021                CALCIUM                  9.0                 04/01/2021                GFRNONAA                 43 (L)              04/01/2021                GFRAA                    >60                 04/19/2020          )        Anesthesia Quick Evaluation

## 2021-04-01 NOTE — Op Note (Signed)
Operative Note  Abigail Beck  HF:[id number]  FC:[id number]  04/01/2021   Surgeon: Clovis Riley MD   Procedure performed: Incision and debridement of necrotizing soft tissue infection of the perineum, perianal field and genitalia 875cm; examination under anesthesia  Debridement type: Excisional Debridement  Side: bilaterally  Body Location: Perineum, genitalia, perianal and gluteal region  Tools used for debridement: scissors and curette and Bovie cautery  Pre-debridement Wound size (cm):   Length: 30        Width: 20     Depth: 3   Post-debridement Wound size (cm):   Length: 35        Width: 25     Depth: 4   Debridement depth beyond dead/damaged tissue down to healthy viable tissue: yes  Tissue layer involved: skin, subcutaneous tissue, muscle / fascia  Nature of tissue removed: Necrotic and Purulence    Preop diagnosis: Necrotizing soft tissue infection Post-op diagnosis/intraop findings: Same   Specimens: Anal mass Retained items: Wet-to-dry packing which will be changed in approximately 24 hours  EBL: [id number] Complications: none   Description of procedure: After obtaining informed consent the patient was taken to the operating room and placed supine on operating room table where general endotracheal anesthesia was initiated, preoperative antibiotics were administered, SCDs applied, and a formal timeout was performed.  A Foley catheter was inserted.  The patient was then repositioned in dorsal lithotomy position with all pressure points appropriately padded. On examination, she had an eschar with underlying necrotic tissue of the left labia, right perineal and groin region just lateral to the labia majora, extensive necrotizing soft tissue infection of bilateral perianal and gluteal regions.  All necrotic tissue was excised using a combination of cautery and sharp dissection, down to bleeding tissue.  Total area excised approximately 875 cm.  Once all necrotic tissue  had been excised, hemostasis was ensured with the electrocautery.   Rectal examination was performed, there was 1 large external hemorrhoidal skin tag which was excised and sent for pathological examination.  On digital exam, the sphincter complex is essentially obliterated by the inflammatory/ infectious process.  The rectal mucosa feels irregular, and there is what feels like a fistulous opening on the right lateral aspect.  This was not really visible.  I was not able to feel or visualize any discrete rectal mass to biopsy but the rectal vault feels irregular.  No appreciable vaginal mass or vaginal fistula.  Damp to dry dressings were then applied and secured with mesh undergarment.  The Foley will remain in place. The patient was then awakened, extubated and taken to PACU in stable condition.    All counts were correct at the completion of the case.    Pre-op:   Post-op:

## 2021-04-01 NOTE — Anesthesia Procedure Notes (Signed)
Procedure Name: Intubation Date/Time: 04/01/2021 8:08 PM Performed by: Rande Brunt, CRNA Pre-anesthesia Checklist: Patient identified, Emergency Drugs available, Suction available and Patient being monitored Patient Re-evaluated:Patient Re-evaluated prior to induction Oxygen Delivery Method: Circle System Utilized Preoxygenation: Pre-oxygenation with 100% oxygen Induction Type: IV induction Ventilation: Mask ventilation without difficulty Laryngoscope Size: Miller and 2 (attempted with MAC 3 with Grade 2b view, grade 1 view with miller 2) Tube type: Oral Number of attempts: 2 Airway Equipment and Method: Stylet and Oral airway Placement Confirmation: ETT inserted through vocal cords under direct vision, positive ETCO2 and breath sounds checked- equal and bilateral Secured at: 22 cm Tube secured with: Tape Dental Injury: Teeth and Oropharynx as per pre-operative assessment  Comments: Attempted with MAC 3 with grade 2b view, grade 1 view with Miller 2.

## 2021-04-01 NOTE — Consult Note (Signed)
Surgical Evaluation Requesting provider: Delia Heady PA-C  Chief Complaint: Perineal wound  HPI: 60 year old woman with multiple significant medical problems as listed below including Crohn's disease, history of hepatitis C, cirrhosis, who presents with multiple complaints including abscesses over multiple parts of her body over the last few weeks, beginning with her vaginal abscess that began approximately mid July and spontaneously drained.  Subsequently noticed abscesses on her left axilla and scalp, she has been treated on doxycycline as an outpatient but had continued and worsening purulent drainage from the perineal area.  Also notes rectal bleeding for which she is scheduled for colonoscopy next week.  This is associated with shortness of breath. She denies any known fevers. She denies any history of previous vaginal lesions, biopsies, surgeries, or history of HPV.  No previous procedures for the hemorrhoids.  Allergies  Allergen Reactions   Lactose Diarrhea and Nausea Only   Lisinopril Cough   Penicillins Nausea And Vomiting    Past Medical History:  Diagnosis Date   Crohn's disease of both small and large intestine with complication ([place]) 123456   Fibroids    Hay fever    Hemorrhoids 03/29/15   some bleeding today   Hepatitis C ~2011/2012   Incidental with physical; unclear etiology   Hypertension    Situational, diet & exercise controlled   PONV (postoperative nausea and vomiting)    Spider bite 01/01/15   treated x 2 months left lower leg ? possible brown recluse     Past Surgical History:  Procedure Laterality Date   TUBAL LIGATION      Family History  Problem Relation Age of Onset   Alzheimer's disease Mother    Diabetes Mother    Glucose-6-phos deficiency Son    Healthy Father        59 yo without medical problems   Healthy Paternal Grandmother        48 yo without medical problems   Liver disease Sister        ?Fatty liver disease    Social History    Socioeconomic History   Marital status: Single    Spouse name: Not on file   Number of children: Not on file   Years of education: college   Highest education level: Not on file  Occupational History   Occupation: pharmceutical representative  Tobacco Use   Smoking status: Never   Smokeless tobacco: Never   Tobacco comments:    NEVER USED TOBACCO  Vaping Use   Vaping Use: Never used  Substance and Sexual Activity   Alcohol use: No    Alcohol/week: 0.0 standard drinks   Drug use: No   Sexual activity: Never    Comment: divorced around 2006, not sexually active since  Other Topics Concern   Not on file  Social History Narrative   Lives alone, son is in [place] (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   Current Outpatient Medications on File Prior to Encounter  Medication Sig Dispense Refill   doxycycline (ADOXA) 100 MG tablet Take 100 mg by mouth 2 (two) times daily. Take 1  by mouth twice daily     ibuprofen (ADVIL) 200 MG tablet Take 200 mg by mouth  every 6 (six) hours as needed.     predniSONE (DELTASONE) 10 MG tablet Take 1 tablet by mouth. Take 1 by mouth once daily (Patient not taking: Reported on 04/01/2021)      Review of Systems: a complete, 10pt review of systems was completed with pertinent positives and negatives as documented in the HPI  Physical Exam: Vitals:   04/01/21 1745 04/01/21 1800  BP: (!) 145/83 (!) 143/87  Pulse: 94 95  Resp:    Temp:    SpO2: 99% 99%   Gen: A&Ox3, no distress  Eyes: lids and conjunctivae normal, no icterus. Pupils equally round and reactive to light.  Neck: supple without mass or thyromegaly Chest: respiratory effort is normal. No  crepitus or tenderness on palpation of the chest. Breath sounds equal.  Cardiovascular: RRR with palpable distal pulses, no pedal edema Gastrointestinal: soft, nondistended, nontender. No mass, hepatomegaly or splenomegaly. No hernia. Lymphatic: no lymphadenopathy in the neck or groin Muscoloskeletal: no clubbing or cyanosis of the fingers.  Strength is symmetrical throughout.  Range of motion of bilateral upper and lower extremities normal without pain, crepitation or contracture. Neuro: cranial nerves grossly intact.  Sensation intact to light touch diffusely. Psych: appropriate mood and affect, normal insight/judgment intact  Skin: warm and dry GU: Left labia with 5 x 2 cm eschar with underlying crepitus and fluctuance, large desquamated area in the right perineum between the labia and the thigh crease with malodorous purulent drainage.  Drained abscess on the left thigh.   CBC Latest Ref Rng & Units 04/01/2021 04/19/2020 03/08/2020  WBC 4.0 - 10.5 K/uL 21.4(H) 4.6 5.1  Hemoglobin 12.0 - 15.0 g/dL 11.9(L) 11.0(L) 7.5(L)  Hematocrit 36.0 - 46.0 % 35.9(L) 35.6(L) 26.2(L)  Platelets 150 - 400 K/uL 248 243 423(H)    CMP Latest Ref Rng & Units 04/01/2021 04/19/2020 03/08/2020  Glucose 70 - 99 mg/dL 133(H) 84 110(H)  BUN 6 - 20 mg/dL 29(H) 14 14  Creatinine 0.44 - 1.00 mg/dL 1.43(H) 0.88 0.86  Sodium 135 - 145 mmol/L 135 138 139  Potassium 3.5 - 5.1 mmol/L 3.5 3.9 3.8  Chloride 98 - 111 mmol/L 103 105 108  CO2 22 - 32 mmol/L 21(L) 26 26  Calcium 8.9 - 10.3 mg/dL 9.0 10.0 9.4  Total Protein 6.5 - 8.1 g/dL 6.8 8.4(H) 8.1  Total Bilirubin 0.3 - 1.2 mg/dL 1.3(H) 0.5 0.4  Alkaline Phos 38 - 126 U/L 145(H) 271(H) 267(H)  AST 15 - 41 U/L 34 86(H) 43(H)  ALT 0 - 44 U/L 27 77(H) 36    Lab Results  Component Value Date   INR 1.08 03/29/2015   INR 0.98 05/15/2013    Imaging: CT ABDOMEN PELVIS W CONTRAST  Result Date: 04/01/2021 CLINICAL DATA:  Anemia, bleeding hemorrhoids, foul-smelling vaginal  odor EXAM: CT ABDOMEN AND PELVIS WITH CONTRAST TECHNIQUE: Multidetector CT imaging of the abdomen and pelvis was performed using the standard protocol following bolus administration of intravenous contrast. CONTRAST:  29m OMNIPAQUE IOHEXOL 300 MG/ML  SOLN COMPARISON:  04/15/2015, 10/20/2020 FINDINGS: Lower chest: No acute pleural or parenchymal lung disease. Hepatobiliary: Stable heterogeneity and capsular retraction along the superior aspect right lobe liver, likely related to patient's known history of cirrhosis. No change in appearance since prior MRI. No intrahepatic duct dilation. Continued distention of the gallbladder without evidence of cholelithiasis or cholecystitis. Pancreas: Unremarkable. No pancreatic ductal dilatation or surrounding inflammatory changes. Pancreatic divisum again incidentally noted. Spleen: Normal in size without focal abnormality. Adrenals/Urinary Tract: Stable right renal  cyst. Otherwise the kidneys enhance normally and symmetrically. No urinary tract calculi or obstructive uropathy. The adrenals and bladder are grossly unremarkable. Stomach/Bowel: No bowel obstruction or ileus. Mild mural thickening of the cecum and proximal ascending colon may reflect inflammatory or infectious colitis. Normal appendix right lower quadrant. Vascular/Lymphatic: The borderline enlarged reactive lymph nodes seen within the upper abdomen on prior MRI and CT are not appreciably changed. No pathologic adenopathy. No significant vascular findings. Reproductive: Heterogeneous uterus consistent with multiple fibroids unchanged. There are no adnexal masses. There is subcutaneous gas within the bilateral labia, with subcu gas and inflammatory changes extending to the perineum. Findings are consistent with Fournier gangrene. No localized fluid collection or abscess. Other: No free intraperitoneal fluid or free gas. No abdominal wall hernia. Musculoskeletal: No acute or destructive bony lesions. Reconstructed  images demonstrate no additional findings. IMPRESSION: 1. 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 2. Mild wall thickening of the proximal colon, consistent with inflammatory bowel disease in a patient with a history of Crohn disease. 3. Stable heterogeneity and capsular retraction of the right lobe liver, unchanged since MRI and most consistent with history of cirrhosis. 4. Fibroid uterus. Critical Value/emergent results were called by telephone at the time of interpretation on 04/01/2021 at 5:28 pm to provider [facility] , who verbally acknowledged these results. Electronically Signed   By: Randa Ngo M.D.   On: 04/01/2021 17:42     A/P: 60 year old woman with multiple medical problems, with what appears to be necrotizing soft tissue infection of the labia and perineal region.  She has a leukocytosis but otherwise no signs of systemic illness at this time.  I do recommend proceeding to the operating room for exam under anesthesia and debridement of necrotic tissue.  I discussed this with her and she is agreeable to proceed.  Discussed risks of bleeding, infection, pain, scarring, need for repeat debridements, and certainty of large open wound which will heal by secondary intention.  Questions welcomed and answered.  OR this evening as soon as the OR is available.     Patient Active Problem List   Diagnosis Date Noted   IDA (iron deficiency anemia) 03/08/2020   Crohn's disease of both small and large intestine with complication ([place]) 123456   Protein-calorie malnutrition, severe ([place]) 04/16/2015   Diarrhea 04/15/2015   Hemorrhoids 04/15/2015   Thrombocytosis 04/15/2015   Alpha thalassemia ([place]) 04/15/2015   Chronic hepatitis C ([place]) 04/15/2015   Abdominal pain, generalized    Hyponatremia    Hepatitis C virus carrier state ([place])    Acute blood loss anemia 05/15/2013   Microcytic anemia  05/15/2013       Romana Juniper, MD [facility]  See AMION to contact appropriate on-call provider

## 2021-04-01 NOTE — ED Triage Notes (Signed)
Pt presents with multiple complaints. States she's SOB because she's anemic, bleeding hemorrhoids , states "more than a tablespoon, when I stand it pours out" boils to her buttocks, vagina, scalp and mouth. Her symptoms have been ongoing x2 weeks. Seen at [place] for hemorrhoids, scheduled for colonoscopy next Saturday. Pt currently taking abx for boils on her head (Doxycycline), she has now noticed them in several places. Pt with bumps and white pocket to her Left lateral jaw. Pt states the boil on her vagina "busted" last week she now has noticed foul smell from her vaginal area. States it looked like it was going to get better but the process has spread and gotten worse'.

## 2021-04-01 NOTE — Transfer of Care (Signed)
Immediate Anesthesia Transfer of Care Note  Patient: Abigail Beck  Procedure(s) Performed: EXAM UNDER ANESTHESIA with Debridement of Fournier Gangrene  Patient Location: PACU  Anesthesia Type:General  Level of Consciousness: drowsy, patient cooperative and responds to stimulation  Airway & Oxygen Therapy: Patient Spontanous Breathing  Post-op Assessment: Report given to RN, Post -op Vital signs reviewed and stable and Patient moving all extremities X 4  Post vital signs: Reviewed and stable  Last Vitals:  Vitals Value Taken Time  BP 130/93 04/01/21 2139  Temp    Pulse 84 04/01/21 2141  Resp 12 04/01/21 2141  SpO2 97 % 04/01/21 2141  Vitals shown include unvalidated device data.  Last Pain:  Vitals:   04/01/21 1749  TempSrc:   PainSc: 6          Complications: No notable events documented.

## 2021-04-01 NOTE — H&P (Signed)
History and Physical    Abigail Beck [id number] DOB: 1960-12-15 DOA: 04/01/2021  PCP: Abigail Fiedler, FNP  Patient coming from: Home  I have personally briefly reviewed patient's old medical records in [place]  Chief Complaint: Multiple abscesses and rectal bleeding  HPI: Abigail Beck is a 60 y.o. female with medical history significant of Crohn's disease not on medications found to have what she reports as a vaginal abscess that ruptured on its own over the past few days.  She has noted increasing number of abscesses including on her scalp and her axilla as well as her bottom over the last few weeks.  She been placed on doxycycline and continues to take this but has noticed increased odor and purulent drainage.  Additionally she reports rectal bleeding which she notes is normal for her with mucus and blood in her stool though she does not state if she has a Crohn's flare at the time.  She had back states she has not been taking any medications for her Crohn's and that has not been acting up. (ED Course: In the ED the patient underwent CT scanning which showed foreign years gangrene General surgery was consulted and she was taken immediately to the OR.  Her WBC was noted to be 21.4 and she had renal insufficiency at 1.43.  Review of Systems: As per HPI otherwise 10 point review of systems negative.   Past Medical History:  Diagnosis Date   Crohn's disease of both small and large intestine with complication ([place]) [id number]   Fibroids    Hay fever    Hemorrhoids 03/29/15   some bleeding today   Hepatitis C ~2011/2012   Incidental with physical; unclear etiology   Hypertension    Situational, diet & exercise controlled   PONV (postoperative nausea and vomiting)    Spider bite 01/01/15   treated x 2 months left lower leg ? possible brown recluse     Past Surgical History:  Procedure Laterality Date   TUBAL LIGATION       reports that she has  never smoked. She has never used smokeless tobacco. She reports that she does not drink alcohol and does not use drugs.  Allergies  Allergen Reactions   Lactose Diarrhea and Nausea Only   Lisinopril Cough   Penicillins Nausea And Vomiting    Family History  Problem Relation Age of Onset   Alzheimer's disease Mother    Diabetes Mother    Glucose-6-phos deficiency Son    Healthy Father        11 yo without medical problems   Healthy Paternal Grandmother        26 yo without medical problems   Liver disease Sister        ?Fatty liver disease     Prior to Admission medications   Medication Sig Start Date End Date Taking? Authorizing Provider  doxycycline (ADOXA) 100 MG tablet Take 100 mg by mouth 2 (two) times daily. Take 1  by mouth twice daily 03/27/21  Yes [provider]  ibuprofen (ADVIL) 200 MG tablet Take 200 mg by mouth every 6 (six) hours as needed.   Yes [provider]  predniSONE (DELTASONE) 10 MG tablet Take 1 tablet by mouth. Take 1 by mouth once daily Patient not taking: Reported on 04/01/2021 03/14/21   [provider]    Physical Exam: Constitutional: NAD, calm, comfortable Vitals:   04/01/21 1700 04/01/21 1745 04/01/21 1800 04/01/21 1900  BP:  (!) 145/83 Marland Kitchen)  143/87 135/86  Pulse:  94 95 96  Resp:    16  Temp:      TempSrc:      SpO2:  99% 99% 99%  Weight: 72.6 kg     Height: '5\' 4"'$  (1.626 m)      Eyes: PERRL, lids and conjunctivae normal ENMT: Mucous membranes are moist. Posterior pharynx clear of any exudate or lesions.Normal dentition.  Neck: normal, supple, no masses, no thyromegaly Respiratory: clear to auscultation bilaterally, no wheezing, no crackles. Normal respiratory effort. No accessory muscle use.  Cardiovascular: Tacky rate and rhythm, no murmurs / rubs / gallops. No extremity edema. 2+ pedal pulses. No carotid bruits.  Abdomen: no tenderness, no masses palpated. No hepatosplenomegaly. Bowel sounds positive.   Musculoskeletal: no clubbing / cyanosis. No joint deformity upper and lower extremities. Good ROM, no contractures. Normal muscle tone.  Skin: Multiple boils in various stages of healing Neurologic: CN 2-12 grossly intact. Sensation intact, DTR normal. Strength 5/5 in all 4.  Psychiatric: Normal judgment and insight. Alert and oriented x 3. Normal mood.   Labs on Admission: I have personally reviewed following labs and imaging studies  CBC: Recent Labs  Lab 04/01/21 1331  WBC 21.4*  NEUTROABS 19.9*  HGB 11.9*  HCT 35.9*  MCV 85.9  PLT [id number]   Basic Metabolic Panel: Recent Labs  Lab 04/01/21 1331  NA 135  K 3.5  CL 103  CO2 21*  GLUCOSE 133*  BUN 29*  CREATININE 1.43*  CALCIUM 9.0   GFR: Estimated Creatinine Clearance: 41.9 mL/min (A) (by C-G formula based on SCr of 1.43 mg/dL (H)). Liver Function Tests: Recent Labs  Lab 04/01/21 1331  AST 34  ALT 27  ALKPHOS 145*  BILITOT 1.3*  PROT 6.8  ALBUMIN 2.1*    Urine analysis:    Component Value Date/Time   COLORURINE YELLOW 04/15/2015 1143   APPEARANCEUR CLEAR 04/15/2015 1143   LABSPEC 1.015 04/15/2015 1143   PHURINE 6.0 04/15/2015 1143   GLUCOSEU NEGATIVE 04/15/2015 1143   HGBUR SMALL (A) 04/15/2015 1143   BILIRUBINUR NEGATIVE 04/15/2015 1143   KETONESUR NEGATIVE 04/15/2015 1143   PROTEINUR 30 (A) 04/15/2015 1143   UROBILINOGEN 0.2 04/15/2015 1143   NITRITE NEGATIVE 04/15/2015 1143   LEUKOCYTESUR TRACE (A) 04/15/2015 1143    Radiological Exams on Admission: CT ABDOMEN PELVIS W CONTRAST  Result Date: 04/01/2021 CLINICAL DATA:  Anemia, bleeding hemorrhoids, foul-smelling vaginal odor EXAM: CT ABDOMEN AND PELVIS WITH CONTRAST TECHNIQUE: Multidetector CT imaging of the abdomen and pelvis was performed using the standard protocol following bolus administration of intravenous contrast. CONTRAST:  71m OMNIPAQUE IOHEXOL 300 MG/ML  SOLN COMPARISON:  04/15/2015, 10/20/2020 FINDINGS: Lower chest: No acute pleural or  parenchymal lung disease. Hepatobiliary: Stable heterogeneity and capsular retraction along the superior aspect right lobe liver, likely related to patient's known history of cirrhosis. No change in appearance since prior MRI. No intrahepatic duct dilation. Continued distention of the gallbladder without evidence of cholelithiasis or cholecystitis. Pancreas: Unremarkable. No pancreatic ductal dilatation or surrounding inflammatory changes. Pancreatic divisum again incidentally noted. Spleen: Normal in size without focal abnormality. Adrenals/Urinary Tract: Stable right renal cyst. Otherwise the kidneys enhance normally and symmetrically. No urinary tract calculi or obstructive uropathy. The adrenals and bladder are grossly unremarkable. Stomach/Bowel: No bowel obstruction or ileus. Mild mural thickening of the cecum and proximal ascending colon may reflect inflammatory or infectious colitis. Normal appendix right lower quadrant. Vascular/Lymphatic: The borderline enlarged reactive lymph nodes seen within the upper abdomen on  prior MRI and CT are not appreciably changed. No pathologic adenopathy. No significant vascular findings. Reproductive: Heterogeneous uterus consistent with multiple fibroids unchanged. There are no adnexal masses. There is subcutaneous gas within the bilateral labia, with subcu gas and inflammatory changes extending to the perineum. Findings are consistent with Fournier gangrene. No localized fluid collection or abscess. Other: No free intraperitoneal fluid or free gas. No abdominal wall hernia. Musculoskeletal: No acute or destructive bony lesions. Reconstructed images demonstrate no additional findings. IMPRESSION: 1. 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 2. Mild wall thickening of the proximal colon, consistent with inflammatory bowel disease in a patient with a history of Crohn disease.  3. Stable heterogeneity and capsular retraction of the right lobe liver, unchanged since MRI and most consistent with history of cirrhosis. 4. Fibroid uterus. Critical Value/emergent results were called by telephone at the time of interpretation on 04/01/2021 at 5:28 pm to provider [facility] , who verbally acknowledged these results. Electronically Signed   By: Randa Ngo M.D.   On: 04/01/2021 17:42     Assessment/Plan Principal Problem:   Fournier's gangrene Active Problems:   Chronic hepatitis C (HCC)   Protein-calorie malnutrition, severe (HCC)   Crohn's disease of both small and large intestine with complication ([place])  Fournier's gangrene Broad-spectrum antibiotics reviewed choice with ID They agree with linezolid and Zosyn. Appreciate general surgery's quick response and debridement. Will follow with their recommendations for return to the OR in wound care.  Chronic hepatitis C  Chronic protein calorie nutrition, severe  Crohn's disease Sounds like this is active, will need outpatient follow-up with GI.  DVT prophylaxis: Lovenox SQ Code Status: Full code  Family Communication: Daughter at bedside Disposition Plan: Home versus SNF depending on further treatment and course. Consults called: General surgery Admission status: Inpatient due to ongoing work-up, need for debridement, IV antibiotics   Donnamae Jude MD [facility]  If 7PM-7AM, please contact night-coverage 04/01/2021, 9:22 PM

## 2021-04-02 ENCOUNTER — Encounter (HOSPITAL_COMMUNITY): Payer: Self-pay | Admitting: Surgery

## 2021-04-02 DIAGNOSIS — R739 Hyperglycemia, unspecified: Secondary | ICD-10-CM

## 2021-04-02 LAB — CBC
HCT: 33.5 % — ABNORMAL LOW (ref 36.0–46.0)
Hemoglobin: 11.5 g/dL — ABNORMAL LOW (ref 12.0–15.0)
MCH: 29.5 pg (ref 26.0–34.0)
MCHC: 34.3 g/dL (ref 30.0–36.0)
MCV: 85.9 fL (ref 80.0–100.0)
Platelets: 239 10*3/uL (ref 150–400)
RBC: 3.9 MIL/uL (ref 3.87–5.11)
RDW: 12.5 % (ref 11.5–15.5)
WBC: 31.4 10*3/uL — ABNORMAL HIGH (ref 4.0–10.5)
nRBC: 0 % (ref 0.0–0.2)

## 2021-04-02 LAB — HEMOGLOBIN A1C
Hgb A1c MFr Bld: 5.6 % (ref 4.8–5.6)
Mean Plasma Glucose: 114.02 mg/dL

## 2021-04-02 LAB — COMPREHENSIVE METABOLIC PANEL
ALT: 24 U/L (ref 0–44)
AST: 33 U/L (ref 15–41)
Albumin: 2.1 g/dL — ABNORMAL LOW (ref 3.5–5.0)
Alkaline Phosphatase: 140 U/L — ABNORMAL HIGH (ref 38–126)
Anion gap: 10 (ref 5–15)
BUN: 26 mg/dL — ABNORMAL HIGH (ref 6–20)
CO2: 19 mmol/L — ABNORMAL LOW (ref 22–32)
Calcium: 9.1 mg/dL (ref 8.9–10.3)
Chloride: 106 mmol/L (ref 98–111)
Creatinine, Ser: 1.18 mg/dL — ABNORMAL HIGH (ref 0.44–1.00)
GFR, Estimated: 54 mL/min — ABNORMAL LOW (ref >60)
Glucose, Bld: 150 mg/dL — ABNORMAL HIGH (ref 70–99)
Potassium: 3.9 mmol/L (ref 3.5–5.1)
Sodium: 135 mmol/L (ref 135–145)
Total Bilirubin: 1 mg/dL (ref 0.3–1.2)
Total Protein: 7 g/dL (ref 6.5–8.1)

## 2021-04-02 LAB — HIV ANTIBODY (ROUTINE TESTING W REFLEX): HIV Screen 4th Generation wRfx: NONREACTIVE

## 2021-04-02 MED ORDER — DOCUSATE SODIUM 100 MG PO CAPS
100.0000 mg | ORAL_CAPSULE | Freq: Every day | ORAL | Status: DC
  Administered 2021-04-06 – 2021-04-10 (×3): 100 mg via ORAL
  Filled 2021-04-02 (×7): qty 1

## 2021-04-02 MED ORDER — CHLORHEXIDINE GLUCONATE CLOTH 2 % EX PADS
6.0000 | MEDICATED_PAD | Freq: Every day | CUTANEOUS | Status: DC
  Administered 2021-04-02 – 2021-04-11 (×9): 6 via TOPICAL

## 2021-04-02 MED ORDER — LACTATED RINGERS IV SOLN: INTRAVENOUS | Status: AC

## 2021-04-02 NOTE — Progress Notes (Signed)
Progress Note  1 Day Post-Op  Subjective: No pain at rest. Eating breakfast without nausea/emesis or abdominal pain. Tolerating foley catheter well She reports she first noted the infection about 1 week ago and started having stool incontinence at that time. Stools having been loose. With her chron's disease she has diarrhea at baseline but with control until 1 week ago.  Daughter is bedside  Objective: Vital signs in last 24 hours: Temp:  [97 F (36.1 C)-98.8 F (37.1 C)] 97.5 F (36.4 C) (08/02 0413) Pulse Rate:  [82-121] 82 (08/02 0413) Resp:  [11-22] 18 (08/02 0413) BP: (128-149)/(71-105) 130/86 (08/02 0413) SpO2:  [98 %-100 %] 100 % (08/02 0413) Weight:  [72.6 kg] 72.6 kg (08/01 1700) Last BM Date: 04/01/21  Intake/Output from previous day: 08/01 0701 - 08/02 0700 In: 1750 [I.V.:1400; IV Piggyback:350] Out: 800 [Urine:750; Blood:50] Intake/Output this shift: No intake/output data recorded.  PE: General: pleasant, WD, female who is laying in bed in NAD HEENT: head is normocephalic. Open draining abscesses x2 of crown of scalp. Mouth is pink and moist Heart: regular, rate, and rhythm.  Palpable radial and pedal pulses bilaterally Lungs: CTAB, no wheezes, rhonchi, or rales noted.  Respiratory effort nonlabored Abd: soft, NT, ND, +BS, no masses, hernias, or organomegaly MSK: all 4 extremities are symmetrical with no cyanosis, clubbing, or edema. No calf TTP GU: active passage of liquid stool during exam and dressing change. Area around labia with pink tissue, healthy margins and appropriate bleeding. One area at left buttock wound margin with fibrinous tissue and pinky to grey at margin Skin: warm and dry. Open draining abscess of upper medial left thigh. Open draining abscess of left thigh Psych: A&Ox3 with an appropriate affect.     Lab Results:  Recent Labs    04/01/21 1331 04/02/21 0130  WBC 21.4* 31.4*  HGB 11.9* 11.5*  HCT 35.9* 33.5*  PLT 248 239    BMET Recent Labs    04/01/21 1331 04/02/21 0130  NA 135 135  K 3.5 3.9  CL 103 106  CO2 21* 19*  GLUCOSE 133* 150*  BUN 29* 26*  CREATININE 1.43* 1.18*  CALCIUM 9.0 9.1   PT/INR No results for input(s): LABPROT, INR in the last 72 hours. CMP     Component Value Date/Time   NA 135 04/02/2021 0130   K 3.9 04/02/2021 0130   CL 106 04/02/2021 0130   CO2 19 (L) 04/02/2021 0130   GLUCOSE 150 (H) 04/02/2021 0130   BUN 26 (H) 04/02/2021 0130   CREATININE 1.18 (H) 04/02/2021 0130   CREATININE 0.88 04/19/2020 1125   CALCIUM 9.1 04/02/2021 0130   PROT 7.0 04/02/2021 0130   ALBUMIN 2.1 (L) 04/02/2021 0130   AST 33 04/02/2021 0130   AST 86 (H) 04/19/2020 1125   ALT 24 04/02/2021 0130   ALT 77 (H) 04/19/2020 1125   ALKPHOS 140 (H) 04/02/2021 0130   BILITOT 1.0 04/02/2021 0130   BILITOT 0.5 04/19/2020 1125   GFRNONAA 54 (L) 04/02/2021 0130   GFRNONAA >60 04/19/2020 1125   GFRAA >60 04/19/2020 1125   Lipase     Component Value Date/Time   LIPASE 17 (L) 04/15/2015 0805       Studies/Results: CT ABDOMEN PELVIS W CONTRAST  Result Date: 04/01/2021 CLINICAL DATA:  Anemia, bleeding hemorrhoids, foul-smelling vaginal odor EXAM: CT ABDOMEN AND PELVIS WITH CONTRAST TECHNIQUE: Multidetector CT imaging of the abdomen and pelvis was performed using the standard protocol following bolus administration of intravenous contrast.  CONTRAST:  72m OMNIPAQUE IOHEXOL 300 MG/ML  SOLN COMPARISON:  04/15/2015, 10/20/2020 FINDINGS: Lower chest: No acute pleural or parenchymal lung disease. Hepatobiliary: Stable heterogeneity and capsular retraction along the superior aspect right lobe liver, likely related to patient's known history of cirrhosis. No change in appearance since prior MRI. No intrahepatic duct dilation. Continued distention of the gallbladder without evidence of cholelithiasis or cholecystitis. Pancreas: Unremarkable. No pancreatic ductal dilatation or surrounding inflammatory changes.  Pancreatic divisum again incidentally noted. Spleen: Normal in size without focal abnormality. Adrenals/Urinary Tract: Stable right renal cyst. Otherwise the kidneys enhance normally and symmetrically. No urinary tract calculi or obstructive uropathy. The adrenals and bladder are grossly unremarkable. Stomach/Bowel: No bowel obstruction or ileus. Mild mural thickening of the cecum and proximal ascending colon may reflect inflammatory or infectious colitis. Normal appendix right lower quadrant. Vascular/Lymphatic: The borderline enlarged reactive lymph nodes seen within the upper abdomen on prior MRI and CT are not appreciably changed. No pathologic adenopathy. No significant vascular findings. Reproductive: Heterogeneous uterus consistent with multiple fibroids unchanged. There are no adnexal masses. There is subcutaneous gas within the bilateral labia, with subcu gas and inflammatory changes extending to the perineum. Findings are consistent with Fournier gangrene. No localized fluid collection or abscess. Other: No free intraperitoneal fluid or free gas. No abdominal wall hernia. Musculoskeletal: No acute or destructive bony lesions. Reconstructed images demonstrate no additional findings. IMPRESSION: 1. 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 2. Mild wall thickening of the proximal colon, consistent with inflammatory bowel disease in a patient with a history of Crohn disease. 3. Stable heterogeneity and capsular retraction of the right lobe liver, unchanged since MRI and most consistent with history of cirrhosis. 4. Fibroid uterus. Critical Value/emergent results were called by telephone at the time of interpretation on 04/01/2021 at 5:28 pm [facility] who verbally acknowledged these results. Electronically Signed   By: MRanda NgoM.D.   On: 04/01/2021 17:42    Anti-infectives: Anti-infectives  (From admission, onward)    Start     Dose/Rate Route Frequency Ordered Stop   04/01/21 2330  piperacillin-tazobactam (ZOSYN) IVPB 3.375 g  Status:  Discontinued        3.375 g 100 mL/hr over 30 Minutes Intravenous Every 8 hours 04/01/21 2230 04/01/21 2238   04/01/21 1800  linezolid (ZYVOX) IVPB 600 mg        600 mg 300 mL/hr over 60 Minutes Intravenous Every 12 hours 04/01/21 1741     04/01/21 1800  piperacillin-tazobactam (ZOSYN) IVPB 3.375 g        3.375 g 12.5 mL/hr over 240 Minutes Intravenous Every 8 hours 04/01/21 1743     04/01/21 1745  clindamycin (CLEOCIN) IVPB 900 mg  Status:  Discontinued        900 mg 100 mL/hr over 30 Minutes Intravenous  Once 04/01/21 1731 04/01/21 1741   04/01/21 1745  piperacillin-tazobactam (ZOSYN) IVPB 3.375 g  Status:  Discontinued        3.375 g 12.5 mL/hr over 240 Minutes Intravenous Every 8 hours 04/01/21 1741 04/01/21 1743        Assessment/Plan Necrotizing soft tissue infection - POD1 s/p   Incision and debridement of necrotizing soft tissue infection of the perineum, perianal field and genitalia 875cm; examination under anesthesia - by Dr. CKae Heller8/1 - OR findings of rectal involvement with inflammation and irregularity - will need an outpatient colonoscopy - continue BID dressing  changes with wet to dry dressing - wet to dry dressing of thigh abscesses - monitor scalp abscesses - continue foley catheter for now - agree with rectal tube for stool diversion  FEN: regular ID: linezolid, zosyn VTE: lovenox Foley: 8/1 to remain for now   LOS: 1 day    Winferd Humphrey, [facility] Surgery 04/02/2021, 8:54 AM Please see Amion for pager number during day hours 7:00am-4:30pm

## 2021-04-02 NOTE — Anesthesia Postprocedure Evaluation (Signed)
Anesthesia Post Note  Patient: Joanne Ouk  Procedure(s) Performed: Exam under anesthesia; Incision and debridement of necrotizing soft tissue infection of the perineum, perianal field and genitalia 875cm; (Perineum)     Patient location during evaluation: PACU Anesthesia Type: General Level of consciousness: awake and alert Pain management: pain level controlled Vital Signs Assessment: post-procedure vital signs reviewed and stable Respiratory status: spontaneous breathing, nonlabored ventilation, respiratory function stable and patient connected to nasal cannula oxygen Cardiovascular status: blood pressure returned to baseline and stable Postop Assessment: no apparent nausea or vomiting Anesthetic complications: no   No notable events documented.  Last Vitals:  Vitals:   04/01/21 2235 04/02/21 0413  BP: (!) 149/105 130/86  Pulse: 95 82  Resp: 17 18  Temp: (!) 36.4 C (!) 36.4 C  SpO2: 100% 100%    Last Pain:  Vitals:   04/02/21 0413  TempSrc: Oral  PainSc:                  March Rummage Kharter Brew

## 2021-04-02 NOTE — Progress Notes (Signed)
PROGRESS NOTE    Abigail Beck  [id number] DOB: 08/01/1961 DOA: 04/01/2021 PCP: Beverley Fiedler, FNP  Brief Narrative: 60 year old female with history of Crohn's disease, liver cirrhosis reported history of vaginal abscesses/boils with ruptured over the past week, this past week she was started on oral doxycycline, despite this continued to have increased odor and discharge, also history of intermittent rectal bleeding. -In the ED she was noted to have a white count of 20 1K, creatinine of 1.4, CT was concerning for Fournier's gangrene involving labia and perineum with extensive soft tissue changes   Assessment & Plan:   Fournier's gangrene Sepsis [place] -Underwent extensive incision and debridement of necrotizing soft tissue of the perineum and perianal field and genitalia by Dr. Windle Guard 8/1 -Continue wound care -Add rectal tube -Continue broad-spectrum antibiotics-Zyvox and Zosyn day 2, follow-up cultures -Continue IV fluids today, cut down rate  Acute kidney injury -Secondary to sepsis, improving -Cut down IV fluids  History of chronic hep C/cirrhosis -Imaging consistent with cirrhosis, follow-up with gastroenterology  Moderate protein calorie malnutrition/hypoalbuminemia -Cirrhosis also contributing, add protein supplements  Hyperglycemia -Check hemoglobin A1c  History of Crohn's disease -Not on active treatment at this time   DVT prophylaxis: Lovenox Code Status: Full code Family Communication: Daughter at bedside Disposition Plan:  Status is: Inpatient  Remains inpatient appropriate because:Inpatient level of care appropriate due to severity of illness  Dispo: The patient is from: Home              Anticipated d/c is to: Home              Patient currently is not medically stable to d/c.   Difficult to place patient No        Consultants:  General surgery  Procedures:     8/1 Surgeon: Clovis Riley MD   Procedure performed: Incision  and debridement of necrotizing soft tissue infection of the perineum, perianal field and genitalia 875cm; examination under anesthesia   Debridement type: Excisional Debridement  Subjective: -Continues to have pain at the surgical site  Objective: Vitals:   04/01/21 2210 04/01/21 2235 04/02/21 0413 04/02/21 0908  BP: (!) 137/97 (!) 149/105 130/86 119/79  Pulse: 86 95 82 74  Resp: '13 17 18 18  '$ Temp: (!) 97 F (36.1 C) (!) 97.5 F (36.4 C) (!) 97.5 F (36.4 C) (!) 97.4 F (36.3 C)  TempSrc:  Oral Oral Oral  SpO2: 100% 100% 100% 100%  Weight:      Height:        Intake/Output Summary (Last 24 hours) at 04/02/2021 1427 Last data filed at 04/02/2021 0908 Gross per 24 hour  Intake 1869.96 ml  Output 800 ml  Net 1069.96 ml   Filed Weights   04/01/21 1700  Weight: 72.6 kg    Examination:  General exam: Pleasant chronically ill female, laying in bed, AAOx3, uncomfortable appearing, dressing ongoing CVS: S1-S2, regular rate rhythm Lungs: Clear bilaterally Abdomen: Soft, nontender, nondistended, bowel sounds present Extremities: No edema Perineum, buttocks with large wound with bleeding/oozing, granulation tissue and fat Skin: As above  Psychiatry:  Mood & affect appropriate.     Data Reviewed:   CBC: Recent Labs  Lab 04/01/21 1331 04/02/21 0130  WBC 21.4* 31.4*  NEUTROABS 19.9*  --   HGB 11.9* 11.5*  HCT 35.9* 33.5*  MCV 85.9 85.9  PLT 248 [id number]   Basic Metabolic Panel: Recent Labs  Lab 04/01/21 1331 04/02/21 0130  NA 135 135  K 3.5 3.9  CL 103 106  CO2 21* 19*  GLUCOSE 133* 150*  BUN 29* 26*  CREATININE 1.43* 1.18*  CALCIUM 9.0 9.1   GFR: Estimated Creatinine Clearance: 50.8 mL/min (A) (by C-G formula based on SCr of 1.18 mg/dL (H)). Liver Function Tests: Recent Labs  Lab 04/01/21 1331 04/02/21 0130  AST 34 33  ALT 27 24  ALKPHOS 145* 140*  BILITOT 1.3* 1.0  PROT 6.8 7.0  ALBUMIN 2.1* 2.1*   No results for input(s): LIPASE, AMYLASE in the  last 168 hours. No results for input(s): AMMONIA in the last 168 hours. Coagulation Profile: No results for input(s): INR, PROTIME in the last 168 hours. Cardiac Enzymes: No results for input(s): CKTOTAL, CKMB, CKMBINDEX, TROPONINI in the last 168 hours. BNP (last 3 results) No results for input(s): PROBNP in the last 8760 hours. HbA1C: No results for input(s): HGBA1C in the last 72 hours. CBG: No results for input(s): GLUCAP in the last 168 hours. Lipid Profile: No results for input(s): CHOL, HDL, LDLCALC, TRIG, CHOLHDL, LDLDIRECT in the last 72 hours. Thyroid Function Tests: No results for input(s): TSH, T4TOTAL, FREET4, T3FREE, THYROIDAB in the last 72 hours. Anemia Panel: No results for input(s): VITAMINB12, FOLATE, FERRITIN, TIBC, IRON, RETICCTPCT in the last 72 hours. Urine analysis:    Component Value Date/Time   COLORURINE YELLOW 04/15/2015 [place] 04/15/2015 1143   LABSPEC 1.015 04/15/2015 1143   PHURINE 6.0 04/15/2015 1143   GLUCOSEU NEGATIVE 04/15/2015 1143   HGBUR SMALL (A) 04/15/2015 1143   BILIRUBINUR NEGATIVE 04/15/2015 1143   KETONESUR NEGATIVE 04/15/2015 1143   PROTEINUR 30 (A) 04/15/2015 1143   UROBILINOGEN 0.2 04/15/2015 1143   NITRITE NEGATIVE 04/15/2015 1143   LEUKOCYTESUR TRACE (A) 04/15/2015 1143   Sepsis Labs: '@LABRCNTIP'$ (procalcitonin:4,lacticidven:4)  ) Recent Results (from the past 240 hour(s))  Resp Panel by RT-PCR (Flu A&B, Covid) Nasopharyngeal Swab     Status: None   Collection Time: 04/01/21  5:32 PM   Specimen: Nasopharyngeal Swab; Nasopharyngeal(NP) swabs in vial transport medium  Result Value Ref Range Status   SARS Coronavirus 2 by RT PCR NEGATIVE NEGATIVE Final    Comment: (NOTE) SARS-CoV-2 target nucleic acids are NOT DETECTED.  The SARS-CoV-2 RNA is generally detectable in upper respiratory specimens during the acute phase of infection. The lowest concentration of SARS-CoV-2 viral copies this assay can detect  is 138 copies/mL. A negative result does not preclude SARS-Cov-2 infection and should not be used as the sole basis for treatment or other patient management decisions. A negative result may occur with  improper specimen collection/handling, submission of specimen other than nasopharyngeal swab, presence of viral mutation(s) within the areas targeted by this assay, and inadequate number of viral copies(<138 copies/mL). A negative result must be combined with clinical observations, patient history, and epidemiological information. The expected result is Negative.  Fact Sheet for Patients:  EntrepreneurPulse.com.au  Fact Sheet for Healthcare Providers:  IncredibleEmployment.be  This test is no t yet approved or cleared by the [place] FDA and  has been authorized for detection and/or diagnosis of SARS-CoV-2 by FDA under an Emergency Use Authorization (EUA). This EUA will remain  in effect (meaning this test can be used) for the duration of the COVID-19 declaration under Section 564(b)(1) of the Act, 21 U.S.C.section 360bbb-3(b)(1), unless the authorization is terminated  or revoked sooner.       Influenza A by PCR NEGATIVE NEGATIVE Final   Influenza B by PCR NEGATIVE NEGATIVE Final    Comment: (  NOTE) The Xpert Xpress SARS-CoV-2/FLU/RSV plus assay is intended as an aid in the diagnosis of influenza from Nasopharyngeal swab specimens and should not be used as a sole basis for treatment. Nasal washings and aspirates are unacceptable for Xpert Xpress SARS-CoV-2/FLU/RSV testing.  Fact Sheet for Patients: EntrepreneurPulse.com.au  Fact Sheet for Healthcare Providers: IncredibleEmployment.be  This test is not yet approved or cleared by the [place] FDA and has been authorized for detection and/or diagnosis of SARS-CoV-2 by FDA under an Emergency Use Authorization (EUA). This EUA will remain in effect  (meaning this test can be used) for the duration of the COVID-19 declaration under Section 564(b)(1) of the Act, 21 U.S.C. section 360bbb-3(b)(1), unless the authorization is terminated or revoked.  Performed at [facility], [place] [address] [place], [place] [address]          Radiology Studies: CT ABDOMEN PELVIS W CONTRAST  Result Date: 04/01/2021 CLINICAL DATA:  Anemia, bleeding hemorrhoids, foul-smelling vaginal odor EXAM: CT ABDOMEN AND PELVIS WITH CONTRAST TECHNIQUE: Multidetector CT imaging of the abdomen and pelvis was performed using the standard protocol following bolus administration of intravenous contrast. CONTRAST:  16m OMNIPAQUE IOHEXOL 300 MG/ML  SOLN COMPARISON:  04/15/2015, 10/20/2020 FINDINGS: Lower chest: No acute pleural or parenchymal lung disease. Hepatobiliary: Stable heterogeneity and capsular retraction along the superior aspect right lobe liver, likely related to patient's known history of cirrhosis. No change in appearance since prior MRI. No intrahepatic duct dilation. Continued distention of the gallbladder without evidence of cholelithiasis or cholecystitis. Pancreas: Unremarkable. No pancreatic ductal dilatation or surrounding inflammatory changes. Pancreatic divisum again incidentally noted. Spleen: Normal in size without focal abnormality. Adrenals/Urinary Tract: Stable right renal cyst. Otherwise the kidneys enhance normally and symmetrically. No urinary tract calculi or obstructive uropathy. The adrenals and bladder are grossly unremarkable. Stomach/Bowel: No bowel obstruction or ileus. Mild mural thickening of the cecum and proximal ascending colon may reflect inflammatory or infectious colitis. Normal appendix right lower quadrant. Vascular/Lymphatic: The borderline enlarged reactive lymph nodes seen within the upper abdomen on prior MRI and CT are not appreciably changed. No pathologic adenopathy. No significant vascular findings. Reproductive:  Heterogeneous uterus consistent with multiple fibroids unchanged. There are no adnexal masses. There is subcutaneous gas within the bilateral labia, with subcu gas and inflammatory changes extending to the perineum. Findings are consistent with Fournier gangrene. No localized fluid collection or abscess. Other: No free intraperitoneal fluid or free gas. No abdominal wall hernia. Musculoskeletal: No acute or destructive bony lesions. Reconstructed images demonstrate no additional findings. IMPRESSION: 1. 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 2. Mild wall thickening of the proximal colon, consistent with inflammatory bowel disease in a patient with a history of Crohn disease. 3. Stable heterogeneity and capsular retraction of the right lobe liver, unchanged since MRI and most consistent with history of cirrhosis. 4. Fibroid uterus. Critical Value/emergent results were called by telephone at the time of interpretation on 04/01/2021 at 5:28 pm to [facility], who verbally acknowledged these results. Electronically Signed   By: MRanda NgoM.D.   On: 04/01/2021 17:42    Scheduled Meds:  Chlorhexidine Gluconate Cloth  6 each Topical Daily   docusate sodium  100 mg Oral BID   enoxaparin (LOVENOX) injection  40 mg Subcutaneous Q24H   Continuous Infusions:  lactated ringers 125 mL/hr at 04/02/21 1219   linezolid (ZYVOX) IV 600 mg (04/02/21 0501)   piperacillin-tazobactam (ZOSYN)  IV 3.375 g (04/02/21 0956)     LOS: 1 day    Time spent: 91mn  PDomenic Polite MD [facility]   04/02/2021, 2:27 PM

## 2021-04-02 NOTE — Progress Notes (Signed)
Rectal tube inserted per order.

## 2021-04-03 LAB — CBC
HCT: 31.5 % — ABNORMAL LOW (ref 36.0–46.0)
Hemoglobin: 10.5 g/dL — ABNORMAL LOW (ref 12.0–15.0)
MCH: 28.8 pg (ref 26.0–34.0)
MCHC: 33.3 g/dL (ref 30.0–36.0)
MCV: 86.3 fL (ref 80.0–100.0)
Platelets: 215 10*3/uL (ref 150–400)
RBC: 3.65 MIL/uL — ABNORMAL LOW (ref 3.87–5.11)
RDW: 12.6 % (ref 11.5–15.5)
WBC: 15.7 10*3/uL — ABNORMAL HIGH (ref 4.0–10.5)
nRBC: 0.2 % (ref 0.0–0.2)

## 2021-04-03 LAB — BASIC METABOLIC PANEL
Anion gap: 8 (ref 5–15)
BUN: 25 mg/dL — ABNORMAL HIGH (ref 6–20)
CO2: 24 mmol/L (ref 22–32)
Calcium: 8.8 mg/dL — ABNORMAL LOW (ref 8.9–10.3)
Chloride: 101 mmol/L (ref 98–111)
Creatinine, Ser: 1.21 mg/dL — ABNORMAL HIGH (ref 0.44–1.00)
GFR, Estimated: 52 mL/min — ABNORMAL LOW (ref >60)
Glucose, Bld: 107 mg/dL — ABNORMAL HIGH (ref 70–99)
Potassium: 3.5 mmol/L (ref 3.5–5.1)
Sodium: 133 mmol/L — ABNORMAL LOW (ref 135–145)

## 2021-04-03 LAB — SURGICAL PATHOLOGY

## 2021-04-03 MED ORDER — BOOST / RESOURCE BREEZE PO LIQD CUSTOM
1.0000 | Freq: Three times a day (TID) | ORAL | Status: DC
  Administered 2021-04-03 – 2021-04-08 (×13): 1 via ORAL

## 2021-04-03 NOTE — Consult Note (Signed)
ENT CONSULT:  Reason for Consult: Left buccal ulcer Referring Physician: General surgery service  Abigail Beck is an 60 y.o. female.   HPI: Patient admitted to medical service the emergency department with a 1 month history of progressive skin infections.  She reports symptoms began in the perineal area with a small soft tissue nodule this gradually progressed with significant findings, multiple abscesses and necrotizing soft tissue involving the perineum, left axilla, lower extremity and scalp.  At the same general time the patient developed a shallow painful ulceration in the left buccal region of the oral cavity.  Question history of bite trauma to that region but no significant acute issues, previous infection or swelling.  No history of salivary gland or lymph node abnormality.  The patient has chronic Crohn's disease and history of hepatitis C.  She underwent extensive surgical debridement of her cutaneous soft tissue infections primarily in the perineal region and axilla.  She presented with elevated white blood cell count and is currently on broad-spectrum IV antibiotic therapy and wound care.  She is a non-smoker.  Past Medical History:  Diagnosis Date   Crohn's disease of both small and large intestine with complication ([place]) 123456   Fibroids    Hay fever    Hemorrhoids 03/29/15   some bleeding today   Hepatitis C ~2011/2012   Incidental with physical; unclear etiology   Hypertension    Situational, diet & exercise controlled   PONV (postoperative nausea and vomiting)    Spider bite 01/01/15   treated x 2 months left lower leg ? possible brown recluse     Past Surgical History:  Procedure Laterality Date   RECTAL EXAM UNDER ANESTHESIA N/A 04/01/2021   Procedure: Exam under anesthesia; Incision and debridement of necrotizing soft tissue infection of the perineum, perianal field and genitalia 875cm;;  Surgeon: Clovis Riley, MD;  Location: [facility] OR;  Service: General;   Laterality: N/A;   TUBAL LIGATION      Family History  Problem Relation Age of Onset   Alzheimer's disease Mother    Diabetes Mother    Glucose-6-phos deficiency Son    Healthy Father        71 yo without medical problems   Healthy Paternal Grandmother        66 yo without medical problems   Liver disease Sister        ?Fatty liver disease    Social History:  reports that she has never smoked. She has never used smokeless tobacco. She reports that she does not drink alcohol and does not use drugs.  Allergies:  Allergies  Allergen Reactions   Lactose Diarrhea and Nausea Only   Lisinopril Cough   Penicillins Nausea And Vomiting    Tolerating Zosyn 04/02/2021    Medications: I have reviewed the patient's current medications.  Results for orders placed or performed during the hospital encounter of 04/01/21 (from the past 48 hour(s))  Surgical pathology     Status: None   Collection Time: 04/01/21  9:15 PM  Result Value Ref Range   SURGICAL PATHOLOGY      SURGICAL PATHOLOGY CASE: [id number] PATIENT: Abigail Beck Surgical Pathology Report     Clinical History: Fournier gangrene (cm)     FINAL MICROSCOPIC DIAGNOSIS:  A. SOFT TISSUE MASS, ANUS, RESECTION: - Acutely inflamed and necrotic skin and subcutaneous tissue.  No malignancy.  GROSS DESCRIPTION:  Received fresh is a 2.3 x 2 x 1 cm ovoid portion of rubbery tan-brown to green tissue.  The cut surface is solid.  There is a separate 1.6 x 0.7 x 0.5 cm portion of soft tan-green tissue.  Sections are submitted in 2 cassettes.  [facility] 04/02/2021)   Final Diagnosis performed by Mark Martinique, MD.   Electronically signed 04/03/2021 Technical and / or Professional components performed at [facility]. [facility], [place] [address], [place], [place] [address].  Immunohistochemistry Technical component (if applicable) was performed at [facility]. [address] [place], [place], [place] [address].   IMMUNOHISTOCHEMISTRY DISCLAIMER (if  applicable): Some of these immunohistochemical stains may have been developed and the performance characteristics determine by [facility]. Some may not have been cleared or approved by the U.S. Food and Drug Administration. The FDA has determined that such clearance or approval is not necessary. This test is used for clinical purposes. It should not be regarded as investigational or for research. This laboratory is certified under the [place] (CLIA-88) as qualified to perform high complexity clinical laboratory testing.  The controls stained appropriately.   HIV Antibody (routine testing w rflx)     Status: None   Collection Time: 04/02/21  1:30 AM  Result Value Ref Range   HIV Screen 4th Generation wRfx Non Reactive Non Reactive    Comment: Performed at [facility], [place] [address], [place], [place] [address]  Comprehensive metabolic panel     Status: Abnormal   Collection Time: 04/02/21  1:30 AM  Result Value Ref Range   Sodium 135 135 - 145 mmol/L   Potassium 3.9 3.5 - 5.1 mmol/L   Chloride 106 98 - 111 mmol/L   CO2 19 (L) 22 - 32 mmol/L   Glucose, Bld 150 (H) 70 - 99 mg/dL    Comment: Glucose reference range applies only to samples taken after fasting for at least 8 hours.   BUN 26 (H) 6 - 20 mg/dL   Creatinine, Ser 1.18 (H) 0.44 - 1.00 mg/dL   Calcium 9.1 8.9 - 10.3 mg/dL   Total Protein 7.0 6.5 - 8.1 g/dL   Albumin 2.1 (L) 3.5 - 5.0 g/dL   AST 33 15 - 41 U/L   ALT 24 0 - 44 U/L   Alkaline Phosphatase 140 (H) 38 - 126 U/L   Total Bilirubin 1.0 0.3 - 1.2 mg/dL   GFR, Estimated 54 (L) >60 mL/min    Comment: (NOTE) Calculated using the CKD-EPI Creatinine Equation (2021)    Anion gap 10 5 - 15    Comment: Performed at [facility], [place] [address], [place], [place] [address]  CBC     Status: Abnormal   Collection Time: 04/02/21  1:30 AM   Result Value Ref Range   WBC 31.4 (H) 4.0 - 10.5 K/uL   RBC 3.90 3.87 - 5.11 MIL/uL   Hemoglobin 11.5 (L) 12.0 - 15.0 g/dL   HCT 33.5 (L) 36.0 - 46.0 %   MCV 85.9 80.0 - 100.0 fL   MCH 29.5 26.0 - 34.0 pg   MCHC 34.3 30.0 - 36.0 g/dL   RDW 12.5 11.5 - 15.5 %   Platelets 239 150 - 400 K/uL   nRBC 0.0 0.0 - 0.2 %    Comment: Performed at [facility], [place] [address] [place], [place] [address]  Hemoglobin A1c     Status: None   Collection Time: 04/02/21  1:30 AM  Result Value Ref Range   Hgb A1c MFr Bld 5.6  4.8 - 5.6 %    Comment: REPEATED TO VERIFY (NOTE) Pre diabetes:          5.7%-6.4%  Diabetes:              >6.4%  Glycemic control for   <7.0% adults with diabetes    Mean Plasma Glucose 114.02 mg/dL    Comment: Performed at [place] [address], [place], [place] [address]  CBC     Status: Abnormal   Collection Time: 04/03/21 12:59 AM  Result Value Ref Range   WBC 15.7 (H) 4.0 - 10.5 K/uL   RBC 3.65 (L) 3.87 - 5.11 MIL/uL   Hemoglobin 10.5 (L) 12.0 - 15.0 g/dL   HCT 31.5 (L) 36.0 - 46.0 %   MCV 86.3 80.0 - 100.0 fL   MCH 28.8 26.0 - 34.0 pg   MCHC 33.3 30.0 - 36.0 g/dL   RDW 12.6 11.5 - 15.5 %   Platelets 215 150 - 400 K/uL   nRBC 0.2 0.0 - 0.2 %    Comment: Performed at [facility], [place] [address] [place], [place] [id number]  Basic metabolic panel     Status: Abnormal   Collection Time: 04/03/21 12:59 AM  Result Value Ref Range   Sodium 133 (L) 135 - 145 mmol/L   Potassium 3.5 3.5 - 5.1 mmol/L   Chloride 101 98 - 111 mmol/L   CO2 24 22 - 32 mmol/L   Glucose, Bld 107 (H) 70 - 99 mg/dL    Comment: Glucose reference range applies only to samples taken after fasting for at least 8 hours.   BUN 25 (H) 6 - 20 mg/dL   Creatinine, Ser 1.21 (H) 0.44 - 1.00 mg/dL   Calcium 8.8 (L) 8.9 - 10.3 mg/dL   GFR, Estimated 52 (L) >60 mL/min    Comment: (NOTE) Calculated using the CKD-EPI Creatinine Equation (2021)    Anion gap 8 5 - 15    Comment:  Performed at [place] [address], [place], [place] [address]    No results found.  [id number] systems reviewed and negative except as stated in HPI    Blood pressure 117/80, pulse 91, temperature 98.6 F (37 C), temperature source Oral, resp. rate 17, height '5\' 4"'$  (1.626 m), weight 72.6 kg, SpO2 100 %.  PHYSICAL EXAM: General appearance - alert, well appearing, and in no distress Beck - normal and patent, no erythema, discharge or polyps and no purulent discharge, polyps or evidence of active infection Mouth -  the patient has a 2 x 3 cm shallow ulceration involving the left buccal mucosa with some exposed buccal fat.  There is eschar over the area which is gradually sloughing and the patient has some early granulation tissue forming.  Small second area of ulceration along the palate. Neck - supple, no significant adenopathy  Studies Reviewed: None  Assessment/Plan: The patient is admitted with a 1 month history of progressive soft tissue infections involving multiple areas including primarily the perineum but also involving the axilla, lower extremities and scalp.  She also complained of discomfort and ulceration in the oral cavity was found to have a relatively large shallow ulceration involving the left buccal region.  Patient's white blood cell count increased to 31,000, HIV negative, blood cultures pending.  Etiology may be related, unclear beyond soft tissue infection with a source of her ulcer may be.  No apparent malignancy and the patient is a non-smoker.  Recommend routine  mouth care, gentle brushing and soft diet as tolerated.  Recommend warm saline mouth rinse after meals.  The patient's current antibiotic therapy should be appropriate for oral mucosal soft tissue infection and I would expect gradual improvement as the remainder of her sites of infection improve.  Plan follow-up as an outpatient in 2 to 3 weeks for recheck.  If she has worsening symptoms or other concerns  please reconsult our service.  Thank you  Jerrell Belfast 04/03/2021, 6:01 PM

## 2021-04-03 NOTE — Progress Notes (Addendum)
PROGRESS NOTE    Abigail Beck  [id number] DOB: 25-Dec-1960 DOA: 04/01/2021 PCP: Beverley Fiedler, FNP  Brief Narrative: 60 year old female with history of Crohn's disease, liver cirrhosis reported history of vaginal abscesses/boils with ruptured over the past week, this past week she was started on oral doxycycline, despite this continued to have increased odor and discharge, also history of intermittent rectal bleeding. -In the ED she was noted to have a white count of 20 1K, creatinine of 1.4, CT was concerning for Fournier's gangrene involving labia and perineum with extensive soft tissue changes   Assessment & Plan:   Fournier's gangrene Sepsis Poa -Underwent extensive incision and debridement of necrotizing soft tissue of the perineum and perianal field and genitalia by Dr. Windle Guard 8/1 -Continue wound care -rectal tube -Continue broad-spectrum antibiotics-Zyvox and Zosyn day 3,  no OR cultures -Per [facility]  Multiple furuncular skin lesions -Appears like disseminated staph infection, agree with blood cultures, has been on antibiotics for 2 days now  Large mucosal ulcer, left buccal mucosa -Etiology is unclear, has a smaller ulcer on the palate as well -Suspect this could be secondary to Crohn's disease -will ask ENT to assess -recently started seeing a GI MD again, lost to FU in the past  Acute kidney injury -Secondary to sepsis, improving -stop IVF  History of chronic hep C/cirrhosis -Imaging consistent with cirrhosis, follow-up with gastroenterology -Per chart review, known history of liver fibrosis was supposed to have a liver biopsy after hep C treatment which apparently did not happen  Moderate protein calorie malnutrition/hypoalbuminemia -Cirrhosis also contributing, protein supplements  Hyperglycemia -hemoglobin A1c is 5.6  History of Crohn's disease -Not on active treatment at this time   DVT prophylaxis: Lovenox Code Status: Full code Family  Communication: Daughter at bedside Disposition Plan:  Status is: Inpatient  Remains inpatient appropriate because:Inpatient level of care appropriate due to severity of illness  Dispo: The patient is from: Home              Anticipated d/c is to: Home              Patient currently is not medically stable to d/c.   Difficult to place patient No        Consultants:  General surgery  Procedures:     8/1 Surgeon: Clovis Riley MD   Procedure performed: Incision and debridement of necrotizing soft tissue infection of the perineum, perianal field and genitalia 875cm; examination under anesthesia   Debridement type: Excisional Debridement  Subjective: -Continues to have pain at the surgical site  Objective: Vitals:   04/02/21 2358 04/03/21 0415 04/03/21 0741 04/03/21 1609  BP: 130/77 130/79 136/79 117/80  Pulse: 76 78 83 91  Resp: '16 16 18 17  '$ Temp: 97.6 F (36.4 C) 97.7 F (36.5 C) 97.6 F (36.4 C) 98.6 F (37 C)  TempSrc: Oral Oral  Oral  SpO2: 100% 100% 99% 100%  Weight:      Height:        Intake/Output Summary (Last 24 hours) at 04/03/2021 1622 Last data filed at 04/03/2021 0600 Gross per 24 hour  Intake 1395.29 ml  Output 1225 ml  Net 170.29 ml   Filed Weights   04/01/21 1700  Weight: 72.6 kg    Examination:  General exam: Pleasant chronically ill female sitting up in bed, AAOx3, no distress CVS: S1-S2, regular rate rhythm Lungs: Clear bilaterally Abdomen: Soft, nontender, nondistended, bowel sounds present Extremities: No edema  Perineum, buttocks with large wound with  bleeding/oozing, granulation tissue and fat Skin: As above  Psychiatry:  Mood & affect appropriate.     Data Reviewed:   CBC: Recent Labs  Lab 04/01/21 1331 04/02/21 0130 04/03/21 0059  WBC 21.4* 31.4* 15.7*  NEUTROABS 19.9*  --   --   HGB 11.9* 11.5* 10.5*  HCT 35.9* 33.5* 31.5*  MCV 85.9 85.9 86.3  PLT 248 239 [id number]   Basic Metabolic Panel: Recent Labs  Lab  04/01/21 1331 04/02/21 0130 04/03/21 0059  NA 135 135 133*  K 3.5 3.9 3.5  CL 103 106 101  CO2 21* 19* 24  GLUCOSE 133* 150* 107*  BUN 29* 26* 25*  CREATININE 1.43* 1.18* 1.21*  CALCIUM 9.0 9.1 8.8*   GFR: Estimated Creatinine Clearance: 49.5 mL/min (A) (by C-G formula based on SCr of 1.21 mg/dL (H)). Liver Function Tests: Recent Labs  Lab 04/01/21 1331 04/02/21 0130  AST 34 33  ALT 27 24  ALKPHOS 145* 140*  BILITOT 1.3* 1.0  PROT 6.8 7.0  ALBUMIN 2.1* 2.1*   No results for input(s): LIPASE, AMYLASE in the last 168 hours. No results for input(s): AMMONIA in the last 168 hours. Coagulation Profile: No results for input(s): INR, PROTIME in the last 168 hours. Cardiac Enzymes: No results for input(s): CKTOTAL, CKMB, CKMBINDEX, TROPONINI in the last 168 hours. BNP (last 3 results) No results for input(s): PROBNP in the last 8760 hours. HbA1C: Recent Labs    04/02/21 0130  HGBA1C 5.6   CBG: No results for input(s): GLUCAP in the last 168 hours. Lipid Profile: No results for input(s): CHOL, HDL, LDLCALC, TRIG, CHOLHDL, LDLDIRECT in the last 72 hours. Thyroid Function Tests: No results for input(s): TSH, T4TOTAL, FREET4, T3FREE, THYROIDAB in the last 72 hours. Anemia Panel: No results for input(s): VITAMINB12, FOLATE, FERRITIN, TIBC, IRON, RETICCTPCT in the last 72 hours. Urine analysis:    Component Value Date/Time   COLORURINE YELLOW 04/15/2015 [place] 04/15/2015 1143   LABSPEC 1.015 04/15/2015 1143   PHURINE 6.0 04/15/2015 1143   GLUCOSEU NEGATIVE 04/15/2015 1143   HGBUR SMALL (A) 04/15/2015 1143   BILIRUBINUR NEGATIVE 04/15/2015 1143   KETONESUR NEGATIVE 04/15/2015 1143   PROTEINUR 30 (A) 04/15/2015 1143   UROBILINOGEN 0.2 04/15/2015 1143   NITRITE NEGATIVE 04/15/2015 1143   LEUKOCYTESUR TRACE (A) 04/15/2015 1143   Sepsis Labs: '@LABRCNTIP'$ (procalcitonin:4,lacticidven:4)  ) Recent Results (from the past 240 hour(s))  Resp Panel by  RT-PCR (Flu A&B, Covid) Nasopharyngeal Swab     Status: None   Collection Time: 04/01/21  5:32 PM   Specimen: Nasopharyngeal Swab; Nasopharyngeal(NP) swabs in vial transport medium  Result Value Ref Range Status   SARS Coronavirus 2 by RT PCR NEGATIVE NEGATIVE Final    Comment: (NOTE) SARS-CoV-2 target nucleic acids are NOT DETECTED.  The SARS-CoV-2 RNA is generally detectable in upper respiratory specimens during the acute phase of infection. The lowest concentration of SARS-CoV-2 viral copies this assay can detect is 138 copies/mL. A negative result does not preclude SARS-Cov-2 infection and should not be used as the sole basis for treatment or other patient management decisions. A negative result may occur with  improper specimen collection/handling, submission of specimen other than nasopharyngeal swab, presence of viral mutation(s) within the areas targeted by this assay, and inadequate number of viral copies(<138 copies/mL). A negative result must be combined with clinical observations, patient history, and epidemiological information. The expected result is Negative.  Fact Sheet for Patients:  EntrepreneurPulse.com.au  Fact Sheet for  Healthcare Providers:  IncredibleEmployment.be  This test is no t yet approved or cleared by the [place] and  has been authorized for detection and/or diagnosis of SARS-CoV-2 by FDA under an Emergency Use Authorization (EUA). This EUA will remain  in effect (meaning this test can be used) for the duration of the COVID-19 declaration under Section 564(b)(1) of the Act, 21 U.S.C.section 360bbb-3(b)(1), unless the authorization is terminated  or revoked sooner.       Influenza A by PCR NEGATIVE NEGATIVE Final   Influenza B by PCR NEGATIVE NEGATIVE Final    Comment: (NOTE) The Xpert Xpress SARS-CoV-2/FLU/RSV plus assay is intended as an aid in the diagnosis of influenza from Nasopharyngeal swab  specimens and should not be used as a sole basis for treatment. Nasal washings and aspirates are unacceptable for Xpert Xpress SARS-CoV-2/FLU/RSV testing.  Fact Sheet for Patients: EntrepreneurPulse.com.au  Fact Sheet for Healthcare Providers: IncredibleEmployment.be  This test is not yet approved or cleared by the [place] FDA and has been authorized for detection and/or diagnosis of SARS-CoV-2 by FDA under an Emergency Use Authorization (EUA). This EUA will remain in effect (meaning this test can be used) for the duration of the COVID-19 declaration under Section 564(b)(1) of the Act, 21 U.S.C. section 360bbb-3(b)(1), unless the authorization is terminated or revoked.  Performed at [facility], [place] [address] [place], [place] [address]          Radiology Studies: CT ABDOMEN PELVIS W CONTRAST  Result Date: 04/01/2021 CLINICAL DATA:  Anemia, bleeding hemorrhoids, foul-smelling vaginal odor EXAM: CT ABDOMEN AND PELVIS WITH CONTRAST TECHNIQUE: Multidetector CT imaging of the abdomen and pelvis was performed using the standard protocol following bolus administration of intravenous contrast. CONTRAST:  23m OMNIPAQUE IOHEXOL 300 MG/ML  SOLN COMPARISON:  04/15/2015, 10/20/2020 FINDINGS: Lower chest: No acute pleural or parenchymal lung disease. Hepatobiliary: Stable heterogeneity and capsular retraction along the superior aspect right lobe liver, likely related to patient's known history of cirrhosis. No change in appearance since prior MRI. No intrahepatic duct dilation. Continued distention of the gallbladder without evidence of cholelithiasis or cholecystitis. Pancreas: Unremarkable. No pancreatic ductal dilatation or surrounding inflammatory changes. Pancreatic divisum again incidentally noted. Spleen: Normal in size without focal abnormality. Adrenals/Urinary Tract: Stable right renal cyst. Otherwise the kidneys enhance normally and  symmetrically. No urinary tract calculi or obstructive uropathy. The adrenals and bladder are grossly unremarkable. Stomach/Bowel: No bowel obstruction or ileus. Mild mural thickening of the cecum and proximal ascending colon may reflect inflammatory or infectious colitis. Normal appendix right lower quadrant. Vascular/Lymphatic: The borderline enlarged reactive lymph nodes seen within the upper abdomen on prior MRI and CT are not appreciably changed. No pathologic adenopathy. No significant vascular findings. Reproductive: Heterogeneous uterus consistent with multiple fibroids unchanged. There are no adnexal masses. There is subcutaneous gas within the bilateral labia, with subcu gas and inflammatory changes extending to the perineum. Findings are consistent with Fournier gangrene. No localized fluid collection or abscess. Other: No free intraperitoneal fluid or free gas. No abdominal wall hernia. Musculoskeletal: No acute or destructive bony lesions. Reconstructed images demonstrate no additional findings. IMPRESSION: 1. 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 2. Mild wall thickening of the proximal colon, consistent with inflammatory bowel disease in a patient with a history of Crohn disease. 3. Stable heterogeneity and capsular retraction of the right lobe liver, unchanged since MRI and most consistent with history of cirrhosis.  4. Fibroid uterus. Critical Value/emergent results were called by telephone at the time of interpretation on 04/01/2021 at 5:28 pm to provider [facility] , who verbally acknowledged these results. Electronically Signed   By: Randa Ngo M.D.   On: 04/01/2021 17:42    Scheduled Meds:  Chlorhexidine Gluconate Cloth  6 each Topical Daily   docusate sodium  100 mg Oral Daily   enoxaparin (LOVENOX) injection  40 mg Subcutaneous Q24H   feeding supplement  1 Container Oral TID BM    Continuous Infusions:  linezolid (ZYVOX) IV 600 mg (04/03/21 0502)   piperacillin-tazobactam (ZOSYN)  IV 3.375 g (04/03/21 1010)     LOS: 2 days    Time spent: 25mn  PDomenic Polite MD [facility]   04/03/2021, 4:22 PM

## 2021-04-03 NOTE — Progress Notes (Addendum)
Progress Note  2 Days Post-Op  Subjective: No acute events. She has already had am dressing change by night shift - significant pain with this. Did get flexiseal yesterday - nothing in bag currently and she states recently she has not been able tell when she is passing stool. Tolerating foley catheter. She notes some pain from lesion in her mouth and low appetite but tolerating diet without abdominal pain, nausea, emesis. She had been on short course of prednisone prior to admission but had already stopped it before coming to the ED  Daughter is bedside  Objective: Vital signs in last 24 hours: Temp:  [97.4 F (36.3 C)-97.9 F (36.6 C)] 97.6 F (36.4 C) (08/03 0741) Pulse Rate:  [74-83] 83 (08/03 0741) Resp:  [16-18] 18 (08/03 0741) BP: (119-136)/(77-91) 136/79 (08/03 0741) SpO2:  [99 %-100 %] 99 % (08/03 0741) Last BM Date: 04/01/21  Intake/Output from previous day: 08/02 0701 - 08/03 0700 In: 1515.3 [P.O.:480; I.V.:869; IV Piggyback:166.3] Out: 1225 [Urine:1225] Intake/Output this shift: No intake/output data recorded.  PE: General: pleasant, WD, female who is laying in bed in NAD HEENT: head is normocephalic. Open draining abscesses x2 of crown of scalp, palpable abscess occipital scalp that begins drain with gentle pressure.  Mouth is pink and moist except for lesion of left posterior buccal mucous which has appearance of grey necrosis with edema Heart: regular, rate, and rhythm.  Palpable radial and pedal pulses bilaterally Lungs: CTAB, no wheezes, rhonchi, or rales noted.  Respiratory effort nonlabored Abd: soft, NT, ND MSK: all 4 extremities are symmetrical with no cyanosis, clubbing, or edema. No calf TTP GU: dressing clean dry and intact Skin: warm and dry. Open draining shallow abscess of upper medial left thigh lateral to groin. Very small and shallow open draining abscess of right lower medial thigh. Left axilla with open shallow draining abscess with fibrinous  tissue along lateral border Psych: A&Ox3 with an appropriate affect.   Lab Results:  Recent Labs    04/02/21 0130 04/03/21 0059  WBC 31.4* 15.7*  HGB 11.5* 10.5*  HCT 33.5* 31.5*  PLT 239 215    BMET Recent Labs    04/02/21 0130 04/03/21 0059  NA 135 133*  K 3.9 3.5  CL 106 101  CO2 19* 24  GLUCOSE 150* 107*  BUN 26* 25*  CREATININE 1.18* 1.21*  CALCIUM 9.1 8.8*    PT/INR No results for input(s): LABPROT, INR in the last 72 hours. CMP     Component Value Date/Time   NA 133 (L) 04/03/2021 0059   K 3.5 04/03/2021 0059   CL 101 04/03/2021 0059   CO2 24 04/03/2021 0059   GLUCOSE 107 (H) 04/03/2021 0059   BUN 25 (H) 04/03/2021 0059   CREATININE 1.21 (H) 04/03/2021 0059   CREATININE 0.88 04/19/2020 1125   CALCIUM 8.8 (L) 04/03/2021 0059   PROT 7.0 04/02/2021 0130   ALBUMIN 2.1 (L) 04/02/2021 0130   AST 33 04/02/2021 0130   AST 86 (H) 04/19/2020 1125   ALT 24 04/02/2021 0130   ALT 77 (H) 04/19/2020 1125   ALKPHOS 140 (H) 04/02/2021 0130   BILITOT 1.0 04/02/2021 0130   BILITOT 0.5 04/19/2020 1125   GFRNONAA 52 (L) 04/03/2021 0059   GFRNONAA >60 04/19/2020 1125   GFRAA >60 04/19/2020 1125   Lipase     Component Value Date/Time   LIPASE 17 (L) 04/15/2015 0805       Studies/Results: CT ABDOMEN PELVIS W CONTRAST  Result Date: 04/01/2021  CLINICAL DATA:  Anemia, bleeding hemorrhoids, foul-smelling vaginal odor EXAM: CT ABDOMEN AND PELVIS WITH CONTRAST TECHNIQUE: Multidetector CT imaging of the abdomen and pelvis was performed using the standard protocol following bolus administration of intravenous contrast. CONTRAST:  29m OMNIPAQUE IOHEXOL 300 MG/ML  SOLN COMPARISON:  04/15/2015, 10/20/2020 FINDINGS: Lower chest: No acute pleural or parenchymal lung disease. Hepatobiliary: Stable heterogeneity and capsular retraction along the superior aspect right lobe liver, likely related to patient's known history of cirrhosis. No change in appearance since prior MRI. No  intrahepatic duct dilation. Continued distention of the gallbladder without evidence of cholelithiasis or cholecystitis. Pancreas: Unremarkable. No pancreatic ductal dilatation or surrounding inflammatory changes. Pancreatic divisum again incidentally noted. Spleen: Normal in size without focal abnormality. Adrenals/Urinary Tract: Stable right renal cyst. Otherwise the kidneys enhance normally and symmetrically. No urinary tract calculi or obstructive uropathy. The adrenals and bladder are grossly unremarkable. Stomach/Bowel: No bowel obstruction or ileus. Mild mural thickening of the cecum and proximal ascending colon may reflect inflammatory or infectious colitis. Normal appendix right lower quadrant. Vascular/Lymphatic: The borderline enlarged reactive lymph nodes seen within the upper abdomen on prior MRI and CT are not appreciably changed. No pathologic adenopathy. No significant vascular findings. Reproductive: Heterogeneous uterus consistent with multiple fibroids unchanged. There are no adnexal masses. There is subcutaneous gas within the bilateral labia, with subcu gas and inflammatory changes extending to the perineum. Findings are consistent with Fournier gangrene. No localized fluid collection or abscess. Other: No free intraperitoneal fluid or free gas. No abdominal wall hernia. Musculoskeletal: No acute or destructive bony lesions. Reconstructed images demonstrate no additional findings. IMPRESSION: 1. 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 2. Mild wall thickening of the proximal colon, consistent with inflammatory bowel disease in a patient with a history of Crohn disease. 3. Stable heterogeneity and capsular retraction of the right lobe liver, unchanged since MRI and most consistent with history of cirrhosis. 4. Fibroid uterus. Critical Value/emergent results were called by telephone at the time of  interpretation on 04/01/2021 at 5:28 pm [facility]ally [facility] these results. Electronically Signed   By: MRanda NgoM.D.   On: 04/01/2021 17:42    Anti-infectives: Anti-infectives (From admission, onward)    Start     Dose/Rate Route Frequency Ordered Stop   04/01/21 2330  piperacillin-tazobactam (ZOSYN) IVPB 3.375 g  Status:  Discontinued        3.375 g 100 mL/hr over 30 Minutes Intravenous Every 8 hours 04/01/21 2230 04/01/21 2238   04/01/21 1800  linezolid (ZYVOX) IVPB 600 mg        600 mg 300 mL/hr over 60 Minutes Intravenous Every 12 hours 04/01/21 1741     04/01/21 1800  piperacillin-tazobactam (ZOSYN) IVPB 3.375 g        3.375 g 12.5 mL/hr over 240 Minutes Intravenous Every 8 hours 04/01/21 1743     04/01/21 1745  clindamycin (CLEOCIN) IVPB 900 mg  Status:  Discontinued        900 mg 100 mL/hr over 30 Minutes Intravenous  Once 04/01/21 1731 04/01/21 1741   04/01/21 1745  piperacillin-tazobactam (ZOSYN) IVPB 3.375 g  Status:  Discontinued        3.375 g 12.5 mL/hr over 240 Minutes Intravenous Every 8 hours 04/01/21 1741 04/01/21 1743        Assessment/Plan Multiple abscess - scalp, L axilla, bilateral LEs Necrotizing soft tissue infection - POD2 s/p  Incision and debridement of necrotizing soft tissue infection of the perineum, perianal field and genitalia 875cm; examination under anesthesia - by Dr. Kae Heller 8/1 - surgical path pending - OR findings of rectal involvement with inflammation and irregularity - will need an outpatient colonoscopy - continue BID dressing changes with wet to dry dressing - will ask night shift to hold off on morning change and fully assess wound tomorrow - plain gauze to thigh abscesses - bid wet to dry dressing to left axilla abscess - monitor scalp abscesses - continue foley catheter for now, continue flexiseal due to wound location and risk for further contamination  - lactose intolerant - okay to use home  protein powder from our standpoint  Recommend ENT consult for mucosal lesion in the mouth Have ordered blood cultures given the dispersion of her wounds - she has received several doses of antibiotics at this point  FEN: regular, ensure breeze, IVF ID: linezolid, zosyn VTE: lovenox Foley: 8/1 to remain for now  AKI Chronic hep C/cirrhosis Hyperglycemia - HgbA1c 5.6 Crohn's disease    LOS: 2 days    Winferd Humphrey, [facility] 04/03/2021, 8:54 AM Please see Amion for pager number during day hours 7:00am-4:30pm

## 2021-04-04 NOTE — Progress Notes (Signed)
PROGRESS NOTE    Abigail Beck  [id number] DOB: 02-09-1961 DOA: 04/01/2021 PCP: Abigail Fiedler, FNP  Brief Narrative: 60 year old female with history of Crohn's disease, liver cirrhosis reported history of vaginal abscesses/boils with ruptured over the past week, this past week she was started on oral doxycycline, despite this continued to have increased odor and discharge, also history of intermittent rectal bleeding. -In the ED she was noted to have a white count of 20 1K, creatinine of 1.4, CT was concerning for Fournier's gangrene involving labia and perineum with extensive soft tissue changes   Assessment & Plan:   Fournier's gangrene Sepsis [place] -Underwent extensive incision and debridement of necrotizing soft tissue of the perineum and perianal field and genitalia by Dr. Windle Beck 8/1 -Continue wound care -rectal tube -Continue broad-spectrum antibiotics-Zyvox and Zosyn day 4,  no OR cultures -Per [facility] -Plan to start hydrotherapy at some point, will de-escalate antibiotics after 7 days  Left axillary wound Multiple furuncular skin lesions -Appears like disseminated staph infection, agree with blood cultures,  -Continue Zyvox  Large mucosal ulcer, left buccal mucosa -Etiology is unclear, has a smaller ulcer on the palate as well -Suspect this could be secondary to Crohn's disease -Appreciate ENT input -recently started seeing a GI MD again, lost to FU in the past  Acute kidney injury -Secondary to sepsis, improving -stop IVF  History of chronic hep C/cirrhosis -Imaging consistent with cirrhosis, follow-up with gastroenterology -Per chart review, known history of liver fibrosis was supposed to have a liver biopsy after hep C treatment which apparently did not happen  Moderate protein calorie malnutrition/hypoalbuminemia -Cirrhosis also contributing, protein supplements  Hyperglycemia -hemoglobin A1c is 5.6  History of Crohn's disease -Not on active  treatment at this time -Suspect oral ulcers could be related to Crohn's disease, she started seeing a new gastroenterologist Dr. Eber Beck at [place], due to have a colonoscopy in the near future   DVT prophylaxis: Lovenox Code Status: Full code Family Communication: Daughter at bedside Disposition Plan:  Status is: Inpatient  Remains inpatient appropriate because:Inpatient level of care appropriate due to severity of illness  Dispo: The patient is from: Home              Anticipated d/c is to: Home              Patient currently is not medically stable to d/c.   Difficult to place patient No  Consultants:  General surgery  Procedures:     8/1 Surgeon: Abigail Riley MD   Procedure performed: Incision and debridement of necrotizing soft tissue infection of the perineum, perianal field and genitalia 875cm; examination under anesthesia   Debridement type: Excisional Debridement  Subjective: -Feels okay overall, tolerating diet, continues to have discomfort at the surgical site  Objective: Vitals:   04/03/21 2344 04/04/21 0353 04/04/21 0836 04/04/21 1210  BP: 121/71 122/64 118/65 128/85  Pulse: 92 93 97 92  Resp: '17 17 17 17  '$ Temp: 98.5 F (36.9 C) 97.9 F (36.6 C) 98.6 F (37 C) 98.5 F (36.9 C)  TempSrc: Oral Oral Oral Oral  SpO2: 100% 100% 100% 99%  Weight:      Height:        Intake/Output Summary (Last 24 hours) at 04/04/2021 1542 Last data filed at 04/04/2021 0119 Gross per 24 hour  Intake 250 ml  Output 850 ml  Net -600 ml   Filed Weights   04/01/21 1700  Weight: 72.6 kg    Examination:  General  exam: Pleasant chronically ill female laying in bed, AAOx3, no distress CVS: S1-S2, regular rate rhythm Lungs: Clear bilaterally Abdomen: Soft, nontender, nondistended, bowel sounds present Extremities: No edema   Perineum, buttocks with large wound with bleeding/oozing, granulation tissue and fat Skin: As above  Psychiatry:  Mood & affect  appropriate.     Data Reviewed:   CBC: Recent Labs  Lab 04/01/21 1331 04/02/21 0130 04/03/21 0059  WBC 21.4* 31.4* 15.7*  NEUTROABS 19.9*  --   --   HGB 11.9* 11.5* 10.5*  HCT 35.9* 33.5* 31.5*  MCV 85.9 85.9 86.3  PLT 248 239 [id number]   Basic Metabolic Panel: Recent Labs  Lab 04/01/21 1331 04/02/21 0130 04/03/21 0059  NA 135 135 133*  K 3.5 3.9 3.5  CL 103 106 101  CO2 21* 19* 24  GLUCOSE 133* 150* 107*  BUN 29* 26* 25*  CREATININE 1.43* 1.18* 1.21*  CALCIUM 9.0 9.1 8.8*   GFR: Estimated Creatinine Clearance: 49.5 mL/min (A) (by C-G formula based on SCr of 1.21 mg/dL (H)). Liver Function Tests: Recent Labs  Lab 04/01/21 1331 04/02/21 0130  AST 34 33  ALT 27 24  ALKPHOS 145* 140*  BILITOT 1.3* 1.0  PROT 6.8 7.0  ALBUMIN 2.1* 2.1*   No results for input(s): LIPASE, AMYLASE in the last 168 hours. No results for input(s): AMMONIA in the last 168 hours. Coagulation Profile: No results for input(s): INR, PROTIME in the last 168 hours. Cardiac Enzymes: No results for input(s): CKTOTAL, CKMB, CKMBINDEX, TROPONINI in the last 168 hours. BNP (last 3 results) No results for input(s): PROBNP in the last 8760 hours. HbA1C: Recent Labs    04/02/21 0130  HGBA1C 5.6   CBG: No results for input(s): GLUCAP in the last 168 hours. Lipid Profile: No results for input(s): CHOL, HDL, LDLCALC, TRIG, CHOLHDL, LDLDIRECT in the last 72 hours. Thyroid Function Tests: No results for input(s): TSH, T4TOTAL, FREET4, T3FREE, THYROIDAB in the last 72 hours. Anemia Panel: No results for input(s): VITAMINB12, FOLATE, FERRITIN, TIBC, IRON, RETICCTPCT in the last 72 hours. Urine analysis:    Component Value Date/Time   COLORURINE YELLOW 04/15/2015 Abigail Beck 04/15/2015 1143   LABSPEC 1.015 04/15/2015 1143   PHURINE 6.0 04/15/2015 1143   GLUCOSEU NEGATIVE 04/15/2015 1143   HGBUR SMALL (A) 04/15/2015 1143   BILIRUBINUR NEGATIVE 04/15/2015 1143   KETONESUR NEGATIVE  04/15/2015 1143   PROTEINUR 30 (A) 04/15/2015 1143   UROBILINOGEN 0.2 04/15/2015 1143   NITRITE NEGATIVE 04/15/2015 1143   LEUKOCYTESUR TRACE (A) 04/15/2015 1143   Sepsis Labs: '@LABRCNTIP'$ (procalcitonin:4,lacticidven:4)  ) Recent Results (from the past 240 hour(s))  Resp Panel by RT-PCR (Flu A&B, Covid) Nasopharyngeal Swab     Status: None   Collection Time: 04/01/21  5:32 PM   Specimen: Nasopharyngeal Swab; Nasopharyngeal(NP) swabs in vial transport medium  Result Value Ref Range Status   SARS Coronavirus 2 by RT PCR NEGATIVE NEGATIVE Final    Comment: (NOTE) SARS-CoV-2 target nucleic acids are NOT DETECTED.  The SARS-CoV-2 RNA is generally detectable in upper respiratory specimens during the acute phase of infection. The lowest concentration of SARS-CoV-2 viral copies this assay can detect is 138 copies/mL. A negative result does not preclude SARS-Cov-2 infection and should not be used as the sole basis for treatment or other patient management decisions. A negative result may occur with  improper specimen collection/handling, submission of specimen other than nasopharyngeal swab, presence of viral mutation(s) within the areas targeted by this assay,  and inadequate number of viral copies(<138 copies/mL). A negative result must be combined with clinical observations, patient history, and epidemiological information. The expected result is Negative.  Fact Sheet for Patients:  EntrepreneurPulse.com.au  Fact Sheet for Healthcare Providers:  IncredibleEmployment.be  This test is no t yet approved or cleared by the [place] FDA and  has been authorized for detection and/or diagnosis of SARS-CoV-2 by FDA under an Emergency Use Authorization (EUA). This EUA will remain  in effect (meaning this test can be used) for the duration of the COVID-19 declaration under Section 564(b)(1) of the Act, 21 U.S.C.section 360bbb-3(b)(1), unless the  authorization is terminated  or revoked sooner.       Influenza A by PCR NEGATIVE NEGATIVE Final   Influenza B by PCR NEGATIVE NEGATIVE Final    Comment: (NOTE) The Xpert Xpress SARS-CoV-2/FLU/RSV plus assay is intended as an aid in the diagnosis of influenza from Nasopharyngeal swab specimens and should not be used as a sole basis for treatment. Nasal washings and aspirates are unacceptable for Xpert Xpress SARS-CoV-2/FLU/RSV testing.  Fact Sheet for Patients: EntrepreneurPulse.com.au  Fact Sheet for Healthcare Providers: IncredibleEmployment.be  This test is not yet approved or cleared by the [place] FDA and has been authorized for detection and/or diagnosis of SARS-CoV-2 by FDA under an Emergency Use Authorization (EUA). This EUA will remain in effect (meaning this test can be used) for the duration of the COVID-19 declaration under Section 564(b)(1) of the Act, 21 U.S.C. section 360bbb-3(b)(1), unless the authorization is terminated or revoked.  Performed at [facility], [place] [address] [place], [place] [address]   Culture, blood (Routine X 2) w Reflex to ID Panel     Status: None (Preliminary result)   Collection Time: 04/03/21 10:01 AM   Specimen: BLOOD LEFT HAND  Result Value Ref Range Status   Specimen Description BLOOD LEFT HAND  Final   Special Requests   Final    BOTTLES DRAWN AEROBIC ONLY Blood Culture results may not be optimal due to an inadequate volume of blood received in culture bottles   Culture   Final    NO GROWTH < 24 HOURS Performed at [facility], [place] [address], [place], [place] [address]    Report Status PENDING  Incomplete  Culture, blood (Routine X 2) w Reflex to ID Panel     Status: None (Preliminary result)   Collection Time: 04/03/21 10:11 AM   Specimen: BLOOD RIGHT HAND  Result Value Ref Range Status   Specimen Description BLOOD RIGHT HAND  Final   Special Requests   Final     BOTTLES DRAWN AEROBIC ONLY Blood Culture results may not be optimal due to an inadequate volume of blood received in culture bottles   Culture   Final    NO GROWTH < 24 HOURS Performed at [facility], [place] [address] [place], [place] [address]    Report Status PENDING  Incomplete    Radiology Studies: No results found.  Scheduled Meds:  Chlorhexidine Gluconate Cloth  6 each Topical Daily   docusate sodium  100 mg Oral Daily   enoxaparin (LOVENOX) injection  40 mg Subcutaneous Q24H   feeding supplement  1 Container Oral TID BM   Continuous Infusions:  linezolid (ZYVOX) IV 600 mg (04/04/21 0444)   piperacillin-tazobactam (ZOSYN)  IV 3.375 g (04/04/21 0928)     LOS: 3 days    Time spent: 22mn  PDomenic Polite MD [facility]  04/04/2021, 3:42 PM

## 2021-04-04 NOTE — Progress Notes (Signed)
Progress Note  3 Days Post-Op  Subjective: She did not sleep well last night. Having pain with dressing changes and some nausea with pain medications. She is tolerating foley and flexi seal well. She is tolerating diet well.  Daughter is bedside  Objective: Vital signs in last 24 hours: Temp:  [97.9 F (36.6 C)-98.6 F (37 C)] 97.9 F (36.6 C) (08/04 0353) Pulse Rate:  [88-93] 93 (08/04 0353) Resp:  [17] 17 (08/04 0353) BP: (117-125)/(64-80) 122/64 (08/04 0353) SpO2:  [100 %] 100 % (08/04 0353) Last BM Date: 04/03/21  Intake/Output from previous day: 08/03 0701 - 08/04 0700 In: 250 [P.O.:200; IV Piggyback:50] Out: 850 [Urine:850] Intake/Output this shift: No intake/output data recorded.  PE: General: pleasant, WD, female who is laying in bed in NAD HEENT: head is normocephalic. Open draining abscesses x2 of crown of scalp, abscess occipital scalp draining Heart: regular, rate, and rhythm.  Palpable radial and pedal pulses bilaterally Lungs: CTAB, no wheezes, rhonchi, or rales noted.  Respiratory effort nonlabored Abd: soft, NT, ND MSK: all 4 extremities are symmetrical with no cyanosis, clubbing, or edema. No calf TTP GU: perineal/buttock wound with beefy red start of granulation tissue at base. Some fibrinous tissue along borders. No necrosis or spreading erythema Skin: warm and dry. Open shallow abscess of upper medial left thigh lateral to groin - development of some eschar. Very small and shallow open abscess of right lower medial thigh - minimal drainage. Left axilla with open shallow draining abscess with worsening fibrinous exudate Psych: A&Ox3 with an appropriate affect.   Perineal wound   Buttock wound   Left axilla    Lab Results:  Recent Labs    04/02/21 0130 04/03/21 0059  WBC 31.4* 15.7*  HGB 11.5* 10.5*  HCT 33.5* 31.5*  PLT 239 215    BMET Recent Labs    04/02/21 0130 04/03/21 0059  NA 135 133*  K 3.9 3.5  CL 106 101  CO2 19* 24   GLUCOSE 150* 107*  BUN 26* 25*  CREATININE 1.18* 1.21*  CALCIUM 9.1 8.8*    PT/INR No results for input(s): LABPROT, INR in the last 72 hours. CMP     Component Value Date/Time   NA 133 (L) 04/03/2021 0059   K 3.5 04/03/2021 0059   CL 101 04/03/2021 0059   CO2 24 04/03/2021 0059   GLUCOSE 107 (H) 04/03/2021 0059   BUN 25 (H) 04/03/2021 0059   CREATININE 1.21 (H) 04/03/2021 0059   CREATININE 0.88 04/19/2020 1125   CALCIUM 8.8 (L) 04/03/2021 0059   PROT 7.0 04/02/2021 0130   ALBUMIN 2.1 (L) 04/02/2021 0130   AST 33 04/02/2021 0130   AST 86 (H) 04/19/2020 1125   ALT 24 04/02/2021 0130   ALT 77 (H) 04/19/2020 1125   ALKPHOS 140 (H) 04/02/2021 0130   BILITOT 1.0 04/02/2021 0130   BILITOT 0.5 04/19/2020 1125   GFRNONAA 52 (L) 04/03/2021 0059   GFRNONAA >60 04/19/2020 1125   GFRAA >60 04/19/2020 1125   Lipase     Component Value Date/Time   LIPASE 17 (L) 04/15/2015 0805       Studies/Results: No results found.  Anti-infectives: Anti-infectives (From admission, onward)    Start     Dose/Rate Route Frequency Ordered Stop   04/01/21 2330  piperacillin-tazobactam (ZOSYN) IVPB 3.375 g  Status:  Discontinued        3.375 g 100 mL/hr over 30 Minutes Intravenous Every 8 hours 04/01/21 2230 04/01/21 2238   04/01/21  1800  linezolid (ZYVOX) IVPB 600 mg        600 mg 300 mL/hr over 60 Minutes Intravenous Every 12 hours 04/01/21 1741     04/01/21 1800  piperacillin-tazobactam (ZOSYN) IVPB 3.375 g        3.375 g 12.5 mL/hr over 240 Minutes Intravenous Every 8 hours 04/01/21 1743     04/01/21 1745  clindamycin (CLEOCIN) IVPB 900 mg  Status:  Discontinued        900 mg 100 mL/hr over 30 Minutes Intravenous  Once 04/01/21 1731 04/01/21 1741   04/01/21 1745  piperacillin-tazobactam (ZOSYN) IVPB 3.375 g  Status:  Discontinued        3.375 g 12.5 mL/hr over 240 Minutes Intravenous Every 8 hours 04/01/21 1741 04/01/21 1743        Assessment/Plan Multiple abscess - scalp, L  axilla, bilateral LEs Necrotizing soft tissue infection - POD3 s/p   Incision and debridement of necrotizing soft tissue infection of the perineum, perianal field and genitalia 875cm; examination under anesthesia - by Dr. Kae Heller 8/1 - surgical path - with acute inflammation and necrosis. No malignancy - follow blood cultures (collected after abx initiation) - OR findings of rectal involvement with inflammation and irregularity - will need an outpatient colonoscopy - continue BID dressing changes with wet to dry dressing. Hopefully will not need further OR debridement. May benefit from hydrotherapy - plain gauze to thigh abscesses - bid wet to dry dressing to left axilla abscess - monitor scalp abscesses - continue foley catheter for now, continue flexiseal due to wound location and risk for further contamination  - lactose intolerant - okay to use home protein powder from our standpoint - ENT consulted of oral mucosal lesion - monitor, IV abx, warm rinses  FEN: regular, ensure breeze, IVF ID: linezolid, zosyn VTE: lovenox Foley: 8/1, to remain for now  AKI Chronic hep C/cirrhosis Hyperglycemia - HgbA1c 5.6 Crohn's disease    LOS: 3 days    Winferd Humphrey, [facility] 04/04/2021, 8:33 AM Please see Amion for pager number during day hours 7:00am-4:30pm

## 2021-04-05 LAB — CBC
HCT: 29.4 % — ABNORMAL LOW (ref 36.0–46.0)
Hemoglobin: 10.1 g/dL — ABNORMAL LOW (ref 12.0–15.0)
MCH: 29.4 pg (ref 26.0–34.0)
MCHC: 34.4 g/dL (ref 30.0–36.0)
MCV: 85.5 fL (ref 80.0–100.0)
Platelets: 190 10*3/uL (ref 150–400)
RBC: 3.44 MIL/uL — ABNORMAL LOW (ref 3.87–5.11)
RDW: 12.7 % (ref 11.5–15.5)
WBC: 11.7 10*3/uL — ABNORMAL HIGH (ref 4.0–10.5)
nRBC: 0.2 % (ref 0.0–0.2)

## 2021-04-05 LAB — BASIC METABOLIC PANEL
Anion gap: 9 (ref 5–15)
BUN: 10 mg/dL (ref 6–20)
CO2: 22 mmol/L (ref 22–32)
Calcium: 7.9 mg/dL — ABNORMAL LOW (ref 8.9–10.3)
Chloride: 102 mmol/L (ref 98–111)
Creatinine, Ser: 0.88 mg/dL (ref 0.44–1.00)
GFR, Estimated: >60 mL/min (ref >60)
Glucose, Bld: 98 mg/dL (ref 70–99)
Potassium: 2.9 mmol/L — ABNORMAL LOW (ref 3.5–5.1)
Sodium: 133 mmol/L — ABNORMAL LOW (ref 135–145)

## 2021-04-05 MED ORDER — PROSOURCE PLUS PO LIQD
30.0000 mL | Freq: Two times a day (BID) | ORAL | Status: DC
  Administered 2021-04-05 – 2021-04-11 (×12): 30 mL via ORAL
  Filled 2021-04-05 (×12): qty 30

## 2021-04-05 MED ORDER — POTASSIUM CHLORIDE CRYS ER 20 MEQ PO TBCR
40.0000 meq | EXTENDED_RELEASE_TABLET | Freq: Two times a day (BID) | ORAL | Status: AC
  Administered 2021-04-05 (×2): 40 meq via ORAL
  Filled 2021-04-05 (×2): qty 2

## 2021-04-05 MED ORDER — ENSURE ENLIVE PO LIQD
237.0000 mL | Freq: Three times a day (TID) | ORAL | Status: DC
  Administered 2021-04-05 – 2021-04-11 (×19): 237 mL via ORAL
  Filled 2021-04-05 (×2): qty 237

## 2021-04-05 MED ORDER — ACETAMINOPHEN 500 MG PO TABS
1000.0000 mg | ORAL_TABLET | Freq: Four times a day (QID) | ORAL | Status: DC
  Administered 2021-04-05 – 2021-04-11 (×18): 1000 mg via ORAL
  Filled 2021-04-05 (×21): qty 2

## 2021-04-05 MED ORDER — ZINC SULFATE 220 (50 ZN) MG PO CAPS
220.0000 mg | ORAL_CAPSULE | Freq: Every day | ORAL | Status: DC
  Administered 2021-04-05 – 2021-04-11 (×7): 220 mg via ORAL
  Filled 2021-04-05 (×7): qty 1

## 2021-04-05 MED ORDER — METHOCARBAMOL 500 MG PO TABS
500.0000 mg | ORAL_TABLET | Freq: Three times a day (TID) | ORAL | Status: DC
  Administered 2021-04-05 – 2021-04-11 (×19): 500 mg via ORAL
  Filled 2021-04-05 (×19): qty 1

## 2021-04-05 MED ORDER — ADULT MULTIVITAMIN W/MINERALS CH
1.0000 | ORAL_TABLET | Freq: Every day | ORAL | Status: DC
  Administered 2021-04-05 – 2021-04-11 (×7): 1 via ORAL
  Filled 2021-04-05 (×7): qty 1

## 2021-04-05 MED ORDER — ASCORBIC ACID 500 MG PO TABS
250.0000 mg | ORAL_TABLET | Freq: Two times a day (BID) | ORAL | Status: DC
  Administered 2021-04-05 – 2021-04-11 (×13): 250 mg via ORAL
  Filled 2021-04-05 (×13): qty 1

## 2021-04-05 NOTE — NC FL2 (Signed)
[place] LEVEL OF CARE SCREENING TOOL     IDENTIFICATION  Patient Name: Abigail Beck Birthdate: 1961/01/20 Sex: female Admission Date (Current Location): 04/01/2021  [facility] and [place] Number:  Herbalist and Address:  [facility] [place], [place] [address] [place], [place] [address]      Provider Number: [id number]  Attending Physician Name and Address:  Domenic Polite, MD  Relative Name and Phone Number:       Current Level of Care: Hospital Recommended Level of Care: [place] Prior Approval Number:    Date Approved/Denied:   PASRR Number: [id number]  Discharge Plan: SNF    Current Diagnoses: Patient Active Problem List   Diagnosis Date Noted   Fournier's gangrene 04/01/2021   IDA (iron deficiency anemia) 03/08/2020   Sickle cell trait ([place]) 12/22/2017   Crohn's disease of both small and large intestine with complication ([place]) [id number]   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   Protein-calorie malnutrition, severe ([place]) 04/16/2015   Severe protein-calorie malnutrition Altamease Oiler: less than 60% of standard weight) ([place]) 04/16/2015   Diarrhea 04/15/2015   Hemorrhoids 04/15/2015   Thrombocytosis 04/15/2015   Alpha thalassemia ([place]) 04/15/2015   Chronic hepatitis C ([place]) 04/15/2015   Abdominal pain, generalized    Hyponatremia    Hepatitis C virus carrier state ([place])    Acute blood loss anemia 05/15/2013   Microcytic anemia 05/15/2013    Orientation RESPIRATION BLADDER Height & Weight     Self, Time, Situation, Place  Normal Continent Weight: 160 lb 0.9 oz (72.6 kg) Height:  '5\' 4"'$  (162.6 cm)  BEHAVIORAL SYMPTOMS/MOOD NEUROLOGICAL BOWEL NUTRITION STATUS      Continent Diet  AMBULATORY STATUS COMMUNICATION OF NEEDS Skin   Independent Verbally Skin abrasions, PU Stage and Appropriate Care (Muliple wounds, including bottocks, right/left malodorous purluent and serosangenous drainage, non pressure wound axilla  anterior left proximal upper, non pressure head posterion, non pressure thigh right and left)                       Personal Care Assistance Level of Assistance  Bathing, Feeding, Dressing Bathing Assistance: Independent Feeding assistance: Independent Dressing Assistance: Independent     Functional Limitations Info  Sight, Hearing, Speech Sight Info: Adequate Hearing Info: Adequate Speech Info: Adequate    SPECIAL CARE FACTORS FREQUENCY                       Contractures Contractures Info: Not present    Additional Factors Info  Code Status, Allergies Code Status Info: Full Allergies Info: Lactose Intolerance ([place])   Lisinopril   Penicillins           Current Medications (04/05/2021):  This is the current hospital active medication list Current Facility-Administered Medications  Medication Dose Route Frequency Provider Last Rate Last Admin   (feeding supplement) PROSource Plus liquid 30 mL  30 mL Oral BID BM Domenic Polite, MD   30 mL at 04/05/21 1429   acetaminophen (TYLENOL) tablet 1,000 mg  1,000 mg Oral Q6H Winferd Humphrey, PA-C       ascorbic acid (VITAMIN C) tablet 250 mg  250 mg Oral BID Domenic Polite, MD   250 mg at 04/05/21 1428   Chlorhexidine Gluconate Cloth 2 % PADS 6 each  6 each Topical Daily Donnamae Jude, MD   6 each at 04/05/21 1106   docusate sodium (COLACE) capsule 100 mg  100 mg Oral Daily Broadus John,  Jacinta Shoe, MD       enoxaparin (LOVENOX) injection 40 mg  40 mg Subcutaneous Q24H Donnamae Jude, MD   40 mg at 04/04/21 2010   feeding supplement (BOOST / RESOURCE BREEZE) liquid 1 Container  1 Container Oral TID BM Winferd Humphrey, PA-C   1 Container at 04/05/21 1430   feeding supplement (ENSURE ENLIVE / ENSURE PLUS) liquid 237 mL  237 mL Oral TID BM Domenic Polite, MD   237 mL at 04/05/21 1430   HYDROmorphone (DILAUDID) injection 0.5-1 mg  0.5-1 mg Intravenous Q2H PRN Romana Juniper A, MD   1 mg at 04/05/21 0428   linezolid (ZYVOX) IVPB 600  mg  600 mg Intravenous Q12H Romana Juniper A, MD 300 mL/hr at 04/05/21 0512 600 mg at 04/05/21 [id number]   methocarbamol (ROBAXIN) tablet 500 mg  500 mg Oral TID Winferd Humphrey, PA-C       metoprolol tartrate (LOPRESSOR) injection 5 mg  5 mg Intravenous Q6H PRN Donnamae Jude, MD       multivitamin with minerals tablet 1 tablet  1 tablet Oral Daily Domenic Polite, MD       ondansetron [facility]) injection 4 mg  4 mg Intravenous Q6H PRN Romana Juniper A, MD   4 mg at 04/04/21 1751   oxyCODONE (Oxy IR/ROXICODONE) immediate release tablet 10 mg  10 mg Oral Q4H PRN Romana Juniper A, MD   10 mg at 04/05/21 1516   oxyCODONE (Oxy IR/ROXICODONE) immediate release tablet 5 mg  5 mg Oral Q4H PRN Donnamae Jude, MD   5 mg at 04/03/21 1006   piperacillin-tazobactam (ZOSYN) IVPB 3.375 g  3.375 g Intravenous Q8H Connor, Chelsea A, MD 12.5 mL/hr at 04/05/21 1105 3.375 g at 04/05/21 1105   polyethylene glycol (MIRALAX / GLYCOLAX) packet 17 g  17 g Oral Daily PRN Donnamae Jude, MD       potassium chloride SA (KLOR-CON) CR tablet 40 mEq  40 mEq Oral BID Domenic Polite, MD   40 mEq at 04/05/21 1102   zinc sulfate capsule 220 mg  220 mg Oral Daily Domenic Polite, MD   220 mg at 04/05/21 1428     Discharge Medications: Please see discharge summary for a list of discharge medications.  Relevant Imaging Results:  Relevant Lab Results:   Additional Information SSN: [id number] covid vaccinated with 1 booster  Emeterio Reeve, LCSW

## 2021-04-05 NOTE — TOC Initial Note (Signed)
Transition of Care [facility]) - Initial/Assessment Note    Patient Details  Name: Abigail Beck MRN: [id number] Date of Birth: 1960-12-11  Transition of Care [facility]) CM/SW Contact:    Emeterio Reeve, LCSW Phone Number: 04/05/2021, 3:48 PM  Clinical Narrative:                  CSW received SNF consult. CSW met with pt at bedside. CSW introduced self and explained role at the hospital. Pt reports that PTA she lived at home alone. Pt works from home. Pt reports she was independent with mobility and ADL's.   CSW reviewed PT/OT recommendations for SNF. Pt reports (she is unsure about SNF but knows she needs wound care. Pt gave CSW permission to fax out to facilities in the area. Pt has no preference of facility at this time. CSW gave pt medicare.gov rating list to review. CSW explained insurance auth process. Pt reports they are covid vaccinated with booster.  CSW will continue to follow.   Expected Discharge Plan: Skilled Nursing Facility Barriers to Discharge: Continued Medical Work up, Ship broker   Patient Goals and CMS Choice Patient states their goals for this hospitalization and ongoing recovery are:: TO get well and return home CMS Medicare.gov Compare Post Acute Care list provided to:: Patient Choice offered to / list presented to : Patient  Expected Discharge Plan and Services Expected Discharge Plan: [place]       Living arrangements for the past 2 months: Single Family Home                                      Prior Living Arrangements/Services Living arrangements for the past 2 months: Single Family Home Lives with:: Self Patient language and need for interpreter reviewed:: Yes        Need for Family Participation in Patient Care: Yes (Comment) Care giver support system in place?: No (comment)   Criminal Activity/Legal Involvement Pertinent to Current Situation/Hospitalization: No - Comment as needed  Activities of Daily  Living Home Assistive Devices/Equipment: Eyeglasses ADL Screening (condition at time of admission) Patient's cognitive ability adequate to safely complete daily activities?: Yes Is the patient deaf or have difficulty hearing?: No Does the patient have difficulty seeing, even when wearing glasses/contacts?: No (reading glasses) Does the patient have difficulty concentrating, remembering, or making decisions?: No Patient able to express need for assistance with ADLs?: Yes Does the patient have difficulty dressing or bathing?: No Independently performs ADLs?: Yes (appropriate for developmental age) Does the patient have difficulty walking or climbing stairs?: No Weakness of Legs: None Weakness of Arms/Hands: None  Permission Sought/Granted Permission sought to share information with : Family Supports, Chartered certified accountant granted to share information with : Yes, Verbal Permission Granted     Permission granted to share info w AGENCY: SNF        Emotional Assessment Appearance:: Appears stated age Attitude/Demeanor/Rapport: Engaged Affect (typically observed): Appropriate Orientation: : Oriented to Self, Oriented to Place, Oriented to  Time, Oriented to Situation Alcohol / Substance Use: Not Applicable Psych Involvement: No (comment)  Admission diagnosis:  Fournier's gangrene [N49.3] Fournier's gangrene in female [N76.89] Patient Active Problem List   Diagnosis Date Noted   Fournier's gangrene 04/01/2021   IDA (iron deficiency anemia) 03/08/2020   Sickle cell trait ([place]) 12/22/2017   Crohn's disease of both small and large intestine with complication ([place]) 91/69/4503  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 [place] [place] [address] Phone: [phone] Fax: [id number] - [place], [place] - [address] AT [place] [address] [place] [address] Phone: [phone] Fax: [phone]     Social Determinants of Health (SDOH) Interventions    Readmission Risk Interventions No flowsheet data found.  Emeterio Reeve, Latanya Presser, [place] Social Worker [phone]

## 2021-04-05 NOTE — Progress Notes (Signed)
Progress Note  4 Days Post-Op  Subjective: Continues to have pain with dressing changes and poor sleep. She has low appetite but is eating and drinking boost shakes. She denies respiratory complaints.  Objective: Vital signs in last 24 hours: Temp:  [98.2 F (36.8 C)-98.5 F (36.9 C)] 98.2 F (36.8 C) (08/05 0900) Pulse Rate:  [89-97] 89 (08/05 0900) Resp:  [16-18] 16 (08/05 0900) BP: (107-130)/(64-85) 107/64 (08/05 0900) SpO2:  [99 %-100 %] 100 % (08/05 0900) Last BM Date: 04/04/21  Intake/Output from previous day: 08/04 0701 - 08/05 0700 In: 590 [P.O.:240; IV Piggyback:350] Out: 1950 [Urine:1950] Intake/Output this shift: No intake/output data recorded.  PE: General: pleasant, WD, female who is laying in bed in NAD HEENT: head is normocephalic. Open draining abscesses x2 of crown of scalp, abscess occipital scalp draining Heart: regular, rate, and rhythm.  Palpable radial and pedal pulses bilaterally Lungs: CTAB, no wheezes, rhonchi, or rales noted.  Respiratory effort nonlabored on room air Abd: soft, NT, ND MSK: all 4 extremities are symmetrical with no cyanosis, clubbing, or edema. No calf TTP GU: perineal/buttock wound with dressing c/d/I - minimally removed for exam with some purulent drainage from apex of wound at skin margin and not from wound base - she requested no further wound evaluation at time of my exam Skin: warm and dry. Open shallow abscess of upper medial left thigh lateral to groin - development of some eschar. Very small and shallow open abscess of right lower medial thigh - minimal drainage. Left axilla with open shallow draining abscess with worsening fibrinous exudate Psych: A&Ox3 with an appropriate affect.    Lab Results:  Recent Labs    04/03/21 0059 04/05/21 0104  WBC 15.7* 11.7*  HGB 10.5* 10.1*  HCT 31.5* 29.4*  PLT 215 190    BMET Recent Labs    04/03/21 0059 04/05/21 0104  NA 133* 133*  K 3.5 2.9*  CL 101 102  CO2 24 22   GLUCOSE 107* 98  BUN 25* 10  CREATININE 1.21* 0.88  CALCIUM 8.8* 7.9*    PT/INR No results for input(s): LABPROT, INR in the last 72 hours. CMP     Component Value Date/Time   NA 133 (L) 04/05/2021 0104   K 2.9 (L) 04/05/2021 0104   CL 102 04/05/2021 0104   CO2 22 04/05/2021 0104   GLUCOSE 98 04/05/2021 0104   BUN 10 04/05/2021 0104   CREATININE 0.88 04/05/2021 0104   CREATININE 0.88 04/19/2020 1125   CALCIUM 7.9 (L) 04/05/2021 0104   PROT 7.0 04/02/2021 0130   ALBUMIN 2.1 (L) 04/02/2021 0130   AST 33 04/02/2021 0130   AST 86 (H) 04/19/2020 1125   ALT 24 04/02/2021 0130   ALT 77 (H) 04/19/2020 1125   ALKPHOS 140 (H) 04/02/2021 0130   BILITOT 1.0 04/02/2021 0130   BILITOT 0.5 04/19/2020 1125   GFRNONAA >60 04/05/2021 0104   GFRNONAA >60 04/19/2020 1125   GFRAA >60 04/19/2020 1125   Lipase     Component Value Date/Time   LIPASE 17 (L) 04/15/2015 0805       Studies/Results: No results found.  Anti-infectives: Anti-infectives (From admission, onward)    Start     Dose/Rate Route Frequency Ordered Stop   04/01/21 2330  piperacillin-tazobactam (ZOSYN) IVPB 3.375 g  Status:  Discontinued        3.375 g 100 mL/hr over 30 Minutes Intravenous Every 8 hours 04/01/21 2230 04/01/21 2238   04/01/21 1800  linezolid (ZYVOX)  IVPB 600 mg        600 mg 300 mL/hr over 60 Minutes Intravenous Every 12 hours 04/01/21 1741     04/01/21 1800  piperacillin-tazobactam (ZOSYN) IVPB 3.375 g        3.375 g 12.5 mL/hr over 240 Minutes Intravenous Every 8 hours 04/01/21 1743     04/01/21 1745  clindamycin (CLEOCIN) IVPB 900 mg  Status:  Discontinued        900 mg 100 mL/hr over 30 Minutes Intravenous  Once 04/01/21 1731 04/01/21 1741   04/01/21 1745  piperacillin-tazobactam (ZOSYN) IVPB 3.375 g  Status:  Discontinued        3.375 g 12.5 mL/hr over 240 Minutes Intravenous Every 8 hours 04/01/21 1741 04/01/21 1743        Assessment/Plan Multiple abscess - scalp, L axilla,  bilateral LEs Necrotizing soft tissue infection - POD4 s/p   Incision and debridement of necrotizing soft tissue infection of the perineum, perianal field and genitalia 875cm; examination under anesthesia - by Dr. Kae Heller 8/1 - surgical path - with acute inflammation and necrosis. No malignancy - follow blood cultures (collected after abx initiation) - NGTD - OR findings of rectal involvement with inflammation and irregularity - will need an outpatient colonoscopy - continue BID dressing changes with wet to dry dressing. Hopefully will not need further OR debridement. At this time do not recommend hydrotherapy - wet to dry bid dressing changes to thigh abscesses - bid wet to dry dressing to left axilla abscess - monitor scalp abscesses - continue foley catheter for now, continue flexiseal due to wound location and risk for further contamination  - lactose intolerant - okay to use home protein powder from our standpoint - ENT consulted of oral mucosal lesion - monitor, IV abx, warm rinses  FEN: regular, ensure breeze, IVF ID: linezolid, zosyn VTE: lovenox Foley: 8/1, to remain for now  AKI Chronic hep C/cirrhosis Hyperglycemia - HgbA1c 5.6 Crohn's disease    LOS: 4 days    Winferd Humphrey, [facility] 04/05/2021, 10:46 AM Please see Amion for pager number during day hours 7:00am-4:30pm

## 2021-04-05 NOTE — Progress Notes (Signed)
PROGRESS NOTE    Abigail Beck  [id number] DOB: October 10, 1960 DOA: 04/01/2021 PCP: Beverley Fiedler, FNP  Brief Narrative: 60 year old female with history of Crohn's disease, liver cirrhosis reported history of vaginal abscesses/boils with ruptured over the past week, this past week she was started on oral doxycycline, despite this continued to have increased odor and discharge, also history of intermittent rectal bleeding. -In the ED she was noted to have a white count of 20 1K, creatinine of 1.4, CT was concerning for Fournier's gangrene involving labia and perineum with extensive soft tissue changes   Assessment & Plan:   Fournier's gangrene Sepsis [place] -Underwent extensive incision and debridement of necrotizing soft tissue of the perineum and perianal field and genitalia by Dr. Windle Guard 8/1 -Continue wound care -rectal tube -Continue broad-spectrum antibiotics-Zyvox and Zosyn day 5,  no OR cultures -Per CCS -De-escalate antibiotics in few days -Anticipate need for SNF for wound care  Left axillary wound Multiple furuncular skin lesions -Appears like disseminated staph infection, blood cultures are negative -Continue Zyvox  Large mucosal ulcer, left buccal mucosa -Etiology is unclear, has a smaller ulcer on the palate as well -Suspect this could be secondary to Crohn's disease -Appreciate ENT input -recently started seeing a GI MD again, lost to FU in the past  Acute kidney injury -Secondary to sepsis, improving -Off IV fluids  History of chronic hep C/cirrhosis -Imaging consistent with cirrhosis, follow-up with gastroenterology -Per chart review, known history of liver fibrosis was supposed to have a liver biopsy after hep C treatment which apparently did not happen  Moderate protein calorie malnutrition/hypoalbuminemia -Cirrhosis also contributing, protein supplements  Hyperglycemia -hemoglobin A1c is 5.6  History of Crohn's disease -Not on active  treatment at this time -Suspect oral ulcers could be related to Crohn's disease, she started seeing a new gastroenterologist Dr. Eber Jones at [facility], due to have a colonoscopy in the near future   DVT prophylaxis: Lovenox Code Status: Full code Family Communication: Daughter at bedside yesterday Disposition Plan:  Status is: Inpatient  Remains inpatient appropriate because:Inpatient level of care appropriate due to severity of illness  Dispo: The patient is from: Home              Anticipated d/c is to: Home              Patient currently is not medically stable to d/c.   Difficult to place patient No  Consultants:  General surgery  Procedures:     8/1 Surgeon: Clovis Riley MD   Procedure performed: Incision and debridement of necrotizing soft tissue infection of the perineum, perianal field and genitalia 875cm; examination under anesthesia   Debridement type: Excisional Debridement  Subjective: -Feels okay overall, tolerating diet, continues to have discomfort at the surgical site  Objective: Vitals:   04/05/21 0016 04/05/21 0347 04/05/21 0900 04/05/21 1129  BP: 119/69 124/73 107/64 119/69  Pulse: 96 95 89 (!) 102  Resp: '18 18 16 17  '$ Temp: 98.2 F (36.8 C) 98.2 F (36.8 C) 98.2 F (36.8 C) 98.2 F (36.8 C)  TempSrc: Oral Oral Oral Oral  SpO2: 100% 100% 100% 100%  Weight:      Height:        Intake/Output Summary (Last 24 hours) at 04/05/2021 1501 Last data filed at 04/05/2021 0622 Gross per 24 hour  Intake 590 ml  Output 1950 ml  Net -1360 ml   Filed Weights   04/01/21 1700  Weight: 72.6 kg    Examination:  General exam: Pleasant chronically ill female laying in bed, AAOx3, no distress CVS: S1-S2, regular rate rhythm Lungs: Clear bilaterally Abdomen: Soft, nontender, nondistended, bowel sounds present Extremities: No edema   Perineum, buttocks with large wound with bleeding/oozing, granulation tissue and fat Skin: As above  Psychiatry:  Mood  & affect appropriate.     Data Reviewed:   CBC: Recent Labs  Lab 04/01/21 1331 04/02/21 0130 04/03/21 0059 04/05/21 0104  WBC 21.4* 31.4* 15.7* 11.7*  NEUTROABS 19.9*  --   --   --   HGB 11.9* 11.5* 10.5* 10.1*  HCT 35.9* 33.5* 31.5* 29.4*  MCV 85.9 85.9 86.3 85.5  PLT 248 239 215 [id number]   Basic Metabolic Panel: Recent Labs  Lab 04/01/21 1331 04/02/21 0130 04/03/21 0059 04/05/21 0104  NA 135 135 133* 133*  K 3.5 3.9 3.5 2.9*  CL 103 106 101 102  CO2 21* 19* 24 22  GLUCOSE 133* 150* 107* 98  BUN 29* 26* 25* 10  CREATININE 1.43* 1.18* 1.21* 0.88  CALCIUM 9.0 9.1 8.8* 7.9*   GFR: Estimated Creatinine Clearance: 68.1 mL/min (by C-G formula based on SCr of 0.88 mg/dL). Liver Function Tests: Recent Labs  Lab 04/01/21 1331 04/02/21 0130  AST 34 33  ALT 27 24  ALKPHOS 145* 140*  BILITOT 1.3* 1.0  PROT 6.8 7.0  ALBUMIN 2.1* 2.1*   No results for input(s): LIPASE, AMYLASE in the last 168 hours. No results for input(s): AMMONIA in the last 168 hours. Coagulation Profile: No results for input(s): INR, PROTIME in the last 168 hours. Cardiac Enzymes: No results for input(s): CKTOTAL, CKMB, CKMBINDEX, TROPONINI in the last 168 hours. BNP (last 3 results) No results for input(s): PROBNP in the last 8760 hours. HbA1C: No results for input(s): HGBA1C in the last 72 hours.  CBG: No results for input(s): GLUCAP in the last 168 hours. Lipid Profile: No results for input(s): CHOL, HDL, LDLCALC, TRIG, CHOLHDL, LDLDIRECT in the last 72 hours. Thyroid Function Tests: No results for input(s): TSH, T4TOTAL, FREET4, T3FREE, THYROIDAB in the last 72 hours. Anemia Panel: No results for input(s): VITAMINB12, FOLATE, FERRITIN, TIBC, IRON, RETICCTPCT in the last 72 hours. Urine analysis:    Component Value Date/Time   COLORURINE YELLOW 04/15/2015 [place] 04/15/2015 1143   LABSPEC 1.015 04/15/2015 1143   PHURINE 6.0 04/15/2015 1143   GLUCOSEU NEGATIVE  04/15/2015 1143   HGBUR SMALL (A) 04/15/2015 1143   BILIRUBINUR NEGATIVE 04/15/2015 1143   KETONESUR NEGATIVE 04/15/2015 1143   PROTEINUR 30 (A) 04/15/2015 1143   UROBILINOGEN 0.2 04/15/2015 1143   NITRITE NEGATIVE 04/15/2015 1143   LEUKOCYTESUR TRACE (A) 04/15/2015 1143   Sepsis Labs: '@LABRCNTIP'$ (procalcitonin:4,lacticidven:4)  ) Recent Results (from the past 240 hour(s))  Resp Panel by RT-PCR (Flu A&B, Covid) Nasopharyngeal Swab     Status: None   Collection Time: 04/01/21  5:32 PM   Specimen: Nasopharyngeal Swab; Nasopharyngeal(NP) swabs in vial transport medium  Result Value Ref Range Status   SARS Coronavirus 2 by RT PCR NEGATIVE NEGATIVE Final    Comment: (NOTE) SARS-CoV-2 target nucleic acids are NOT DETECTED.  The SARS-CoV-2 RNA is generally detectable in upper respiratory specimens during the acute phase of infection. The lowest concentration of SARS-CoV-2 viral copies this assay can detect is 138 copies/mL. A negative result does not preclude SARS-Cov-2 infection and should not be used as the sole basis for treatment or other patient management decisions. A negative result may occur with  improper specimen collection/handling,  submission of specimen other than nasopharyngeal swab, presence of viral mutation(s) within the areas targeted by this assay, and inadequate number of viral copies(<138 copies/mL). A negative result must be combined with clinical observations, patient history, and epidemiological information. The expected result is Negative.  Fact Sheet for Patients:  EntrepreneurPulse.com.au  Fact Sheet for Healthcare Providers:  IncredibleEmployment.be  This test is no t yet approved or cleared by the [place] FDA and  has been authorized for detection and/or diagnosis of SARS-CoV-2 by FDA under an Emergency Use Authorization (EUA). This EUA will remain  in effect (meaning this test can be used) for the duration of  the COVID-19 declaration under Section 564(b)(1) of the Act, 21 U.S.C.section 360bbb-3(b)(1), unless the authorization is terminated  or revoked sooner.       Influenza A by PCR NEGATIVE NEGATIVE Final   Influenza B by PCR NEGATIVE NEGATIVE Final    Comment: (NOTE) The Xpert Xpress SARS-CoV-2/FLU/RSV plus assay is intended as an aid in the diagnosis of influenza from Nasopharyngeal swab specimens and should not be used as a sole basis for treatment. Nasal washings and aspirates are unacceptable for Xpert Xpress SARS-CoV-2/FLU/RSV testing.  Fact Sheet for Patients: EntrepreneurPulse.com.au  Fact Sheet for Healthcare Providers: IncredibleEmployment.be  This test is not yet approved or cleared by the [place] FDA and has been authorized for detection and/or diagnosis of SARS-CoV-2 by FDA under an Emergency Use Authorization (EUA). This EUA will remain in effect (meaning this test can be used) for the duration of the COVID-19 declaration under Section 564(b)(1) of the Act, 21 U.S.C. section 360bbb-3(b)(1), unless the authorization is terminated or revoked.  Performed at [facility], [place] [address] [place], [place] [address]   Culture, blood (Routine X 2) w Reflex to ID Panel     Status: None (Preliminary result)   Collection Time: 04/03/21 10:01 AM   Specimen: BLOOD LEFT HAND  Result Value Ref Range Status   Specimen Description BLOOD LEFT HAND  Final   Special Requests   Final    BOTTLES DRAWN AEROBIC ONLY Blood Culture results may not be optimal due to an inadequate volume of blood received in culture bottles   Culture   Final    NO GROWTH 2 DAYS Performed at [facility], [place] [address], [place], [place] [address]    Report Status PENDING  Incomplete  Culture, blood (Routine X 2) w Reflex to ID Panel     Status: None (Preliminary result)   Collection Time: 04/03/21 10:11 AM   Specimen: BLOOD RIGHT HAND  Result Value  Ref Range Status   Specimen Description BLOOD RIGHT HAND  Final   Special Requests   Final    BOTTLES DRAWN AEROBIC ONLY Blood Culture results may not be optimal due to an inadequate volume of blood received in culture bottles   Culture   Final    NO GROWTH 2 DAYS Performed at [facility], [place] [address], [place], [place] [address]    Report Status PENDING  Incomplete    Radiology Studies: No results found.  Scheduled Meds:  (feeding supplement) PROSource Plus  30 mL Oral BID BM   vitamin C  250 mg Oral BID   Chlorhexidine Gluconate Cloth  6 each Topical Daily   docusate sodium  100 mg Oral Daily   enoxaparin (LOVENOX) injection  40 mg Subcutaneous Q24H   feeding supplement  1 Container Oral TID BM   feeding supplement  237  mL Oral TID BM   multivitamin with minerals  1 tablet Oral Daily   potassium chloride  40 mEq Oral BID   zinc sulfate  220 mg Oral Daily   Continuous Infusions:  linezolid (ZYVOX) IV 600 mg (04/05/21 0512)   piperacillin-tazobactam (ZOSYN)  IV 3.375 g (04/05/21 1105)     LOS: 4 days    Time spent: 57mn  PDomenic Polite MD [facility]   04/05/2021, 3:01 PM

## 2021-04-05 NOTE — Progress Notes (Signed)
Initial Nutrition Assessment  DOCUMENTATION CODES:   Not applicable  INTERVENTION:   Liberalize diet to REGULAR  Ensure Enlive po TID, each supplement provides 350 kcal and 20 grams of protein  30 ml ProSource Plus TID, each supplement provides 100 kcals and 15 grams protein.   Add MVI with Minerals Add Vitamin C 250 mg BID, Zinc sulfate 220 mg daily.  Check Vitamin C, Vitamin A and Zinc levels   NUTRITION DIAGNOSIS:   Increased nutrient needs related to wound healing as evidenced by estimated needs.  GOAL:   Patient will meet greater than or equal to 90% of their needs  MONITOR:   PO intake, Supplement acceptance, Labs, Weight trends  REASON FOR ASSESSMENT:   Rounds    ASSESSMENT:   60 yo female necrotizing soft tissue infection to labia, perineum in addition to lesions to skalp, L axilla, bilateral LE and mouth. PMH includes Crohn's disease, liver chorris, vaginal abscess/boils  8/01 I/D to perineum, perianal field and genitalia  Pt with poor appetite and mouth pain impeding po intake. Pt had not touched breakfast on visit today. No recorded po intake. Pt sipping on Boost Breeze. Discussed changing supplement to Ensure Enlive/Plus which provides higher calories/protein in same volume. Pt is lactose intolerant but Ensure is suitable for those with lactose intolerance. Pt is agreeable to trying. Also discussed use of protein modular and pt agreeable to this as well.   Explained the importance of adequate nutrition with regards to wound healing. Also explained that if pt unable to take adequate nutrition by mouth then need to consider nutrition support (Cortrak vs TPN). Pt understands this as well  Pt denies weight loss; reports UBW 155-160   Rectal tube in place  Labs: potassium 2.9 (L), sodium 133 (L), Creatinine wdl, HgbA1c 5.6 Meds: KCL   Diet Order:   Diet Order             Diet Heart Room service appropriate? Yes; Fluid consistency: Thin  Diet effective  now                   EDUCATION NEEDS:   Education needs have been addressed  Skin:  Skin Assessment: Skin Integrity Issues: Skin Integrity Issues:: Other (Comment) Other: necrotizing wounds to perineum, perianal and vaginal areas; lesions to scalp, l axilla, LE, mouth  Last BM:  8/5 rectal tube  Height:   Ht Readings from Last 1 Encounters:  04/01/21 '5\' 4"'$  (1.626 m)    Weight:   Wt Readings from Last 1 Encounters:  04/01/21 72.6 kg    BMI:  Body mass index is 27.47 kg/m.  Estimated Nutritional Needs:   Kcal:  2150-2350 kcals  Protein:  120-140 g  Fluid:  >/= 2 L   Kerman Passey MS, RDN, LDN, CNSC Registered Dietitian III Clinical Nutrition RD Pager and On-Call Pager Number Located in [place]

## 2021-04-06 MED ORDER — COLLAGENASE 250 UNIT/GM EX OINT
TOPICAL_OINTMENT | Freq: Every day | CUTANEOUS | Status: DC
  Filled 2021-04-06: qty 30

## 2021-04-06 NOTE — Progress Notes (Signed)
PROGRESS NOTE    Abigail Beck  [id number] DOB: 10-08-1960 DOA: 04/01/2021 PCP: Beverley Fiedler, FNP  Brief Narrative: 60 year old female with history of Crohn's disease, liver cirrhosis reported history of vaginal abscesses/boils with ruptured over 1 week, she was started on oral doxycycline, despite this continued to have increased odor and discharge, also history of intermittent rectal bleeding. -In the ED she was noted to have a white count of 21K, creatinine of 1.4, CT was concerning for Fournier's gangrene involving labia and perineum with extensive soft tissue changes -Underwent extensive incision and debridement of necrotizing soft tissue by Dr. Windle Guard on 8/1   Assessment & Plan:   Fournier's gangrene Sepsis Po[place] -Underwent extensive incision and debridement of necrotizing soft tissue of the perineum and perianal field and genitalia by Dr. Windle Guard 8/1 -Continue wound care -rectal tube -Continue broad-spectrum antibiotics-Zyvox and Zosyn day 6,   no OR cultures -Per [facility] -De-escalate antibiotics in 48 hours -Anticipate need for SNF for wound care  Left axillary wound Multiple furuncular skin lesions -Appears like disseminated staph infection, blood cultures are negative -Continue Zyvox  Large mucosal ulcer, left buccal mucosa -Etiology is unclear, has a smaller ulcer on the palate as well -Suspect this could be secondary to Crohn's disease -Appreciate ENT input -recently started seeing a GI MD again, lost to FU in the past  Acute kidney injury -Secondary to sepsis, improving -Off IV fluids  History of chronic hep C/cirrhosis -Imaging consistent with cirrhosis, follow-up with gastroenterology -Per chart review, known history of liver fibrosis was supposed to have a liver biopsy after hep C treatment which apparently did not happen  Moderate protein calorie malnutrition/hypoalbuminemia -Cirrhosis also contributing, protein  supplements  Hyperglycemia -hemoglobin A1c is 5.6  History of Crohn's disease -Not on active treatment at this time -Suspect oral ulcers could be related to Crohn's disease, she started seeing a new gastroenterologist Dr. Eber Jones at [facility], due to have a colonoscopy in the near future   DVT prophylaxis: Lovenox Code Status: Full code Family Communication: Daughter at bedside Disposition Plan:  Status is: Inpatient  Remains inpatient appropriate because:Inpatient level of care appropriate due to severity of illness  Dispo: The patient is from: Home              Anticipated d/c is to: SNF              Patient currently is not medically stable to d/c.   Difficult to place patient No  Consultants:  General surgery  Procedures:     8/1 Surgeon: Clovis Riley MD   Procedure performed: Incision and debridement of necrotizing soft tissue infection of the perineum, perianal field and genitalia 875cm; examination under anesthesia   Debridement type: Excisional Debridement  Subjective: -Feels okay overall, eating better.  Objective: Vitals:   04/05/21 1129 04/05/21 2052 04/06/21 0531 04/06/21 0931  BP: 119/69 118/82 116/75 111/69  Pulse: (!) 102 90 92 97  Resp: '17 15 16 16  '$ Temp: 98.2 F (36.8 C) 98.1 F (36.7 C) 98 F (36.7 C) 98.6 F (37 C)  TempSrc: Oral Oral Oral Oral  SpO2: 100% 98% 99% 100%  Weight:      Height:        Intake/Output Summary (Last 24 hours) at 04/06/2021 1244 Last data filed at 04/06/2021 0500 Gross per 24 hour  Intake 400 ml  Output 1400 ml  Net -1000 ml   Filed Weights   04/01/21 1700  Weight: 72.6 kg    Examination:  General exam: Pleasant chronically ill female, laying in bed, AAOx3, no distress CVS: S1-S2, regular rate rhythm Lungs: Clear bilaterally Abdomen: Soft, nontender, nondistended, bowel sounds present Extremities: No edema   Perineum, buttocks with large wound with bleeding/oozing, granulation tissue and  fat Skin: As above  Psychiatry:  Mood & affect appropriate.     Data Reviewed:   CBC: Recent Labs  Lab 04/01/21 1331 04/02/21 0130 04/03/21 0059 04/05/21 0104  WBC 21.4* 31.4* 15.7* 11.7*  NEUTROABS 19.9*  --   --   --   HGB 11.9* 11.5* 10.5* 10.1*  HCT 35.9* 33.5* 31.5* 29.4*  MCV 85.9 85.9 86.3 85.5  PLT 248 239 215 [id number]   Basic Metabolic Panel: Recent Labs  Lab 04/01/21 1331 04/02/21 0130 04/03/21 0059 04/05/21 0104  NA 135 135 133* 133*  K 3.5 3.9 3.5 2.9*  CL 103 106 101 102  CO2 21* 19* 24 22  GLUCOSE 133* 150* 107* 98  BUN 29* 26* 25* 10  CREATININE 1.43* 1.18* 1.21* 0.88  CALCIUM 9.0 9.1 8.8* 7.9*   GFR: Estimated Creatinine Clearance: 68.1 mL/min (by C-G formula based on SCr of 0.88 mg/dL). Liver Function Tests: Recent Labs  Lab 04/01/21 1331 04/02/21 0130  AST 34 33  ALT 27 24  ALKPHOS 145* 140*  BILITOT 1.3* 1.0  PROT 6.8 7.0  ALBUMIN 2.1* 2.1*   No results for input(s): LIPASE, AMYLASE in the last 168 hours. No results for input(s): AMMONIA in the last 168 hours. Coagulation Profile: No results for input(s): INR, PROTIME in the last 168 hours. Cardiac Enzymes: No results for input(s): CKTOTAL, CKMB, CKMBINDEX, TROPONINI in the last 168 hours. BNP (last 3 results) No results for input(s): PROBNP in the last 8760 hours. HbA1C: No results for input(s): HGBA1C in the last 72 hours.  CBG: No results for input(s): GLUCAP in the last 168 hours. Lipid Profile: No results for input(s): CHOL, HDL, LDLCALC, TRIG, CHOLHDL, LDLDIRECT in the last 72 hours. Thyroid Function Tests: No results for input(s): TSH, T4TOTAL, FREET4, T3FREE, THYROIDAB in the last 72 hours. Anemia Panel: No results for input(s): VITAMINB12, FOLATE, FERRITIN, TIBC, IRON, RETICCTPCT in the last 72 hours. Urine analysis:    Component Value Date/Time   COLORURINE YELLOW 04/15/2015 [place] 04/15/2015 1143   LABSPEC 1.015 04/15/2015 1143   PHURINE 6.0  04/15/2015 1143   GLUCOSEU NEGATIVE 04/15/2015 1143   HGBUR SMALL (A) 04/15/2015 1143   BILIRUBINUR NEGATIVE 04/15/2015 1143   KETONESUR NEGATIVE 04/15/2015 1143   PROTEINUR 30 (A) 04/15/2015 1143   UROBILINOGEN 0.2 04/15/2015 1143   NITRITE NEGATIVE 04/15/2015 1143   LEUKOCYTESUR TRACE (A) 04/15/2015 1143   Sepsis Labs: '@LABRCNTIP'$ (procalcitonin:4,lacticidven:4)  ) Recent Results (from the past 240 hour(s))  Resp Panel by RT-PCR (Flu A&B, Covid) Nasopharyngeal Swab     Status: None   Collection Time: 04/01/21  5:32 PM   Specimen: Nasopharyngeal Swab; Nasopharyngeal(NP) swabs in vial transport medium  Result Value Ref Range Status   SARS Coronavirus 2 by RT PCR NEGATIVE NEGATIVE Final    Comment: (NOTE) SARS-CoV-2 target nucleic acids are NOT DETECTED.  The SARS-CoV-2 RNA is generally detectable in upper respiratory specimens during the acute phase of infection. The lowest concentration of SARS-CoV-2 viral copies this assay can detect is 138 copies/mL. A negative result does not preclude SARS-Cov-2 infection and should not be used as the sole basis for treatment or other patient management decisions. A negative result may occur with  improper specimen collection/handling,  submission of specimen other than nasopharyngeal swab, presence of viral mutation(s) within the areas targeted by this assay, and inadequate number of viral copies(<138 copies/mL). A negative result must be combined with clinical observations, patient history, and epidemiological information. The expected result is Negative.  Fact Sheet for Patients:  EntrepreneurPulse.com.au  Fact Sheet for Healthcare Providers:  IncredibleEmployment.be  This test is no t yet approved or cleared by the [place] FDA and  has been authorized for detection and/or diagnosis of SARS-CoV-2 by FDA under an Emergency Use Authorization (EUA). This EUA will remain  in effect (meaning this  test can be used) for the duration of the COVID-19 declaration under Section 564(b)(1) of the Act, 21 U.S.C.section 360bbb-3(b)(1), unless the authorization is terminated  or revoked sooner.       Influenza A by PCR NEGATIVE NEGATIVE Final   Influenza B by PCR NEGATIVE NEGATIVE Final    Comment: (NOTE) The Xpert Xpress SARS-CoV-2/FLU/RSV plus assay is intended as an aid in the diagnosis of influenza from Nasopharyngeal swab specimens and should not be used as a sole basis for treatment. Nasal washings and aspirates are unacceptable for Xpert Xpress SARS-CoV-2/FLU/RSV testing.  Fact Sheet for Patients: EntrepreneurPulse.com.au  Fact Sheet for Healthcare Providers: IncredibleEmployment.be  This test is not yet approved or cleared by the [place] FDA and has been authorized for detection and/or diagnosis of SARS-CoV-2 by FDA under an Emergency Use Authorization (EUA). This EUA will remain in effect (meaning this test can be used) for the duration of the COVID-19 declaration under Section 564(b)(1) of the Act, 21 U.S.C. section 360bbb-3(b)(1), unless the authorization is terminated or revoked.  Performed at [facility], [place] [address] [place], [place] [address]   Culture, blood (Routine X 2) w Reflex to ID Panel     Status: None (Preliminary result)   Collection Time: 04/03/21 10:01 AM   Specimen: BLOOD LEFT HAND  Result Value Ref Range Status   Specimen Description BLOOD LEFT HAND  Final   Special Requests   Final    BOTTLES DRAWN AEROBIC ONLY Blood Culture results may not be optimal due to an inadequate volume of blood received in culture bottles   Culture   Final    NO GROWTH 3 DAYS Performed at [facility], [place] [address] [place], [place] [address]    Report Status PENDING  Incomplete  Culture, blood (Routine X 2) w Reflex to ID Panel     Status: None (Preliminary result)   Collection Time: 04/03/21 10:11 AM    Specimen: BLOOD RIGHT HAND  Result Value Ref Range Status   Specimen Description BLOOD RIGHT HAND  Final   Special Requests   Final    BOTTLES DRAWN AEROBIC ONLY Blood Culture results may not be optimal due to an inadequate volume of blood received in culture bottles   Culture   Final    NO GROWTH 3 DAYS Performed at [facility], [place] [address], [place], [place] [address]    Report Status PENDING  Incomplete    Radiology Studies: No results found.  Scheduled Meds:  (feeding supplement) PROSource Plus  30 mL Oral BID BM   acetaminophen  1,000 mg Oral Q6H   vitamin C  250 mg Oral BID   Chlorhexidine Gluconate Cloth  6 each Topical Daily   collagenase   Topical Daily   docusate sodium  100 mg Oral Daily   enoxaparin (LOVENOX) injection  40 mg Subcutaneous Q24H  feeding supplement  1 Container Oral TID BM   feeding supplement  237 mL Oral TID BM   methocarbamol  500 mg Oral TID   multivitamin with minerals  1 tablet Oral Daily   zinc sulfate  220 mg Oral Daily   Continuous Infusions:  linezolid (ZYVOX) IV 600 mg (04/06/21 0532)   piperacillin-tazobactam (ZOSYN)  IV 3.375 g (04/06/21 1012)     LOS: 5 days    Time spent: 63mn  PDomenic Polite MD [facility]   04/06/2021, 12:44 PM

## 2021-04-06 NOTE — Progress Notes (Signed)
Progress Note  5 Days Post-Op  Subjective: Continues to have pain with dressing changes.  Leaking some stool still around flexi-seal  Objective: Vital signs in last 24 hours: Temp:  [98 F (36.7 C)-98.6 F (37 C)] 98.6 F (37 C) (08/06 0931) Pulse Rate:  [90-97] 97 (08/06 0931) Resp:  [15-16] 16 (08/06 0931) BP: (111-118)/(69-82) 111/69 (08/06 0931) SpO2:  [98 %-100 %] 100 % (08/06 0931) Last BM Date: 04/05/21  Intake/Output from previous day: 08/05 0701 - 08/06 0700 In: 400 [P.O.:400] Out: 1400 [Urine:800; Stool:600] Intake/Output this shift: No intake/output data recorded.  PE: Skin: perineal wound overall clean, but the anterior aspect where he labia are/were has some goopy fibrin tissue.  There is stool leakage around her flex-seal with contamination of the wound.  L thigh wound with loose eschar present.  L axillary wound cleaning up but still with some fibrinous exudate, but granulation tissue present mixed in.   Lab Results:  Recent Labs    04/05/21 0104  WBC 11.7*  HGB 10.1*  HCT 29.4*  PLT 190   BMET Recent Labs    04/05/21 0104  NA 133*  K 2.9*  CL 102  CO2 22  GLUCOSE 98  BUN 10  CREATININE 0.88  CALCIUM 7.9*   PT/INR No results for input(s): LABPROT, INR in the last 72 hours. CMP     Component Value Date/Time   NA 133 (L) 04/05/2021 0104   K 2.9 (L) 04/05/2021 0104   CL 102 04/05/2021 0104   CO2 22 04/05/2021 0104   GLUCOSE 98 04/05/2021 0104   BUN 10 04/05/2021 0104   CREATININE 0.88 04/05/2021 0104   CREATININE 0.88 04/19/2020 1125   CALCIUM 7.9 (L) 04/05/2021 0104   PROT 7.0 04/02/2021 0130   ALBUMIN 2.1 (L) 04/02/2021 0130   AST 33 04/02/2021 0130   AST 86 (H) 04/19/2020 1125   ALT 24 04/02/2021 0130   ALT 77 (H) 04/19/2020 1125   ALKPHOS 140 (H) 04/02/2021 0130   BILITOT 1.0 04/02/2021 0130   BILITOT 0.5 04/19/2020 1125   GFRNONAA >60 04/05/2021 0104   GFRNONAA >60 04/19/2020 1125   GFRAA >60 04/19/2020 1125   Lipase      Component Value Date/Time   LIPASE 17 (L) 04/15/2015 0805       Studies/Results: No results found.  Anti-infectives: Anti-infectives (From admission, onward)    Start     Dose/Rate Route Frequency Ordered Stop   04/01/21 2330  piperacillin-tazobactam (ZOSYN) IVPB 3.375 g  Status:  Discontinued        3.375 g 100 mL/hr over 30 Minutes Intravenous Every 8 hours 04/01/21 2230 04/01/21 2238   04/01/21 1800  linezolid (ZYVOX) IVPB 600 mg        600 mg 300 mL/hr over 60 Minutes Intravenous Every 12 hours 04/01/21 1741     04/01/21 1800  piperacillin-tazobactam (ZOSYN) IVPB 3.375 g        3.375 g 12.5 mL/hr over 240 Minutes Intravenous Every 8 hours 04/01/21 1743     04/01/21 1745  clindamycin (CLEOCIN) IVPB 900 mg  Status:  Discontinued        900 mg 100 mL/hr over 30 Minutes Intravenous  Once 04/01/21 1731 04/01/21 1741   04/01/21 1745  piperacillin-tazobactam (ZOSYN) IVPB 3.375 g  Status:  Discontinued        3.375 g 12.5 mL/hr over 240 Minutes Intravenous Every 8 hours 04/01/21 1741 04/01/21 1743        Assessment/Plan POD  5, s/p incision and debridement of necrotizing soft tissue infection of perineum, perianal field and genitalia with EUA by Dr. Kae Heller on 8/1  Multiple abscess - scalp, L axilla, bilateral LEs - surgical path - with acute inflammation and necrosis. No malignancy - follow blood cultures (collected after abx initiation) - NGTD - OR findings of rectal involvement with inflammation and irregularity - will need an outpatient colonoscopy and may need diverting ostomy given amount of stool leakage - wd dressing changes to all wound, will ask hydrotherapy to see for perineal wound to help clean this up -add santyl to left thigh wound for eschar - monitor scalp abscesses - continue foley catheter for now, continue flexiseal due to wound location and risk for further contamination   FEN: regular, ensure breeze, IVF ID: linezolid, zosyn VTE: lovenox Foley:  8/1, to remain for now  AKI Chronic hep C/cirrhosis Hyperglycemia - HgbA1c 5.6 Crohn's disease    LOS: 5 days    Abigail Beck, [facility] 04/06/2021, 12:17 PM Please see Amion for pager number during day hours 7:00am-4:30pm

## 2021-04-07 LAB — CBC
HCT: 28.8 % — ABNORMAL LOW (ref 36.0–46.0)
Hemoglobin: 9.8 g/dL — ABNORMAL LOW (ref 12.0–15.0)
MCH: 29.8 pg (ref 26.0–34.0)
MCHC: 34 g/dL (ref 30.0–36.0)
MCV: 87.5 fL (ref 80.0–100.0)
Platelets: 226 10*3/uL (ref 150–400)
RBC: 3.29 MIL/uL — ABNORMAL LOW (ref 3.87–5.11)
RDW: 13.9 % (ref 11.5–15.5)
WBC: 11 10*3/uL — ABNORMAL HIGH (ref 4.0–10.5)
nRBC: 0 % (ref 0.0–0.2)

## 2021-04-07 LAB — BASIC METABOLIC PANEL
Anion gap: 5 (ref 5–15)
BUN: 8 mg/dL (ref 6–20)
CO2: 24 mmol/L (ref 22–32)
Calcium: 8.1 mg/dL — ABNORMAL LOW (ref 8.9–10.3)
Chloride: 102 mmol/L (ref 98–111)
Creatinine, Ser: 0.83 mg/dL (ref 0.44–1.00)
GFR, Estimated: >60 mL/min (ref >60)
Glucose, Bld: 133 mg/dL — ABNORMAL HIGH (ref 70–99)
Potassium: 4.1 mmol/L (ref 3.5–5.1)
Sodium: 131 mmol/L — ABNORMAL LOW (ref 135–145)

## 2021-04-07 NOTE — Progress Notes (Signed)
PROGRESS NOTE    Netra Vessels  [id number] DOB: [birth year] DOA: 04/01/2021 PCP: Beverley Fiedler, FNP  Brief Narrative: 60 year old female with history of Crohn's disease, liver cirrhosis reported history of vaginal abscesses/boils with ruptured over 1 week, she was started on oral doxycycline, despite this continued to have increased odor and discharge, also history of intermittent rectal bleeding. -In the ED she was noted to have a white count of 21K, creatinine of 1.4, CT was concerning for Fournier's gangrene involving labia and perineum with extensive soft tissue changes -Underwent extensive incision and debridement of necrotizing soft tissue by Dr. Windle Guard on 8/1   Assessment & Plan:   Fournier's gangrene Sepsis Poa -Underwent extensive incision and debridement of necrotizing soft tissue of the perineum and perianal field and genitalia by Dr. Windle Guard 8/1 -Continue wound care -Has a rectal tube and Foley catheter currently to prevent contamination -Continue broad-spectrum antibiotics-Zyvox and Zosyn day 7,   no OR cultures -Per [facility], started hydrotherapy -Could discontinue antibiotics tomorrow -Anticipate need for SNF for wound care  Left axillary wound Multiple furuncular skin lesions -Appears like disseminated staph infection, blood cultures are negative -Continue Zyvox  Large mucosal ulcer, left buccal mucosa -Etiology is unclear, has a smaller ulcer on the palate as well -Suspect this could be secondary to Crohn's disease -Appreciate ENT input -recently started seeing a GI MD again, lost to FU in the past  Chronic anemia -Mild worsening in the setting of chronic blood loss from wounds -Check anemia panel with a.m. labs  Acute kidney injury -Secondary to sepsis, improving -Off IV fluids  History of chronic hep C/cirrhosis -Imaging consistent with cirrhosis, follow-up with gastroenterology -Per chart review, known history of liver fibrosis was supposed  to have a liver biopsy after hep C treatment which apparently did not happen  Moderate protein calorie malnutrition/hypoalbuminemia -Cirrhosis also contributing, protein supplements  Hyperglycemia -hemoglobin A1c is 5.6  History of Crohn's disease -Not on active treatment at this time -Suspect oral ulcers could be related to Crohn's disease, she started seeing a new gastroenterologist Dr. Eber Jones at [facility], due to have a colonoscopy in the near future   DVT prophylaxis: Lovenox Code Status: Full code Family Communication: Daughter at bedside Disposition Plan:  Status is: Inpatient  Remains inpatient appropriate because:Inpatient level of care appropriate due to severity of illness  Dispo: The patient is from: Home              Anticipated d/c is to: SNF              Patient currently is not medically stable to d/c.   Difficult to place patient No  Consultants:  General surgery  Procedures:     8/1 Surgeon: Clovis Riley MD   Procedure performed: Incision and debridement of necrotizing soft tissue infection of the perineum, perianal field and genitalia 875cm; examination under anesthesia   Debridement type: Excisional Debridement  Subjective: -Feels okay, denies any complaints, getting hydrotherapy this morning  Objective: Vitals:   04/06/21 1325 04/06/21 1751 04/06/21 2024 04/07/21 0528  BP: 113/79 122/72 (!) 119/98 113/64  Pulse: 92 97 84 (!) 109  Resp: '16 16 18 16  '$ Temp: 97.7 F (36.5 C) 98 F (36.7 C) 98.1 F (36.7 C) 98.3 F (36.8 C)  TempSrc: Oral Oral Oral Oral  SpO2: 100% 100% 100% 99%  Weight:      Height:        Intake/Output Summary (Last 24 hours) at 04/07/2021 1232 Last data filed  at 04/07/2021 1017 Gross per 24 hour  Intake 300 ml  Output 2450 ml  Net -2150 ml   Filed Weights   04/01/21 1700  Weight: 72.6 kg    Examination:  General exam: Pleasant female, laying in bed, AAOx3, no distress CVS: S1-S2, regular rate  rhythm Lungs: Clear bilaterally Abdomen: Soft, nontender, nondistended, bowel sounds present Extremities: No edema  Perineum, buttocks with large wound with bleeding/oozing, granulation tissue and fat Skin: As above  Psychiatry:  Mood & affect appropriate.     Data Reviewed:   CBC: Recent Labs  Lab 04/01/21 1331 04/02/21 0130 04/03/21 0059 04/05/21 0104 04/07/21 0638  WBC 21.4* 31.4* 15.7* 11.7* 11.0*  NEUTROABS 19.9*  --   --   --   --   HGB 11.9* 11.5* 10.5* 10.1* 9.8*  HCT 35.9* 33.5* 31.5* 29.4* 28.8*  MCV 85.9 85.9 86.3 85.5 87.5  PLT 248 239 215 190 [id number]   Basic Metabolic Panel: Recent Labs  Lab 04/01/21 1331 04/02/21 0130 04/03/21 0059 04/05/21 0104 04/07/21 0638  NA 135 135 133* 133* 131*  K 3.5 3.9 3.5 2.9* 4.1  CL 103 106 101 102 102  CO2 21* 19* '24 22 24  '$ GLUCOSE 133* 150* 107* 98 133*  BUN 29* 26* 25* 10 8  CREATININE 1.43* 1.18* 1.21* 0.88 0.83  CALCIUM 9.0 9.1 8.8* 7.9* 8.1*   GFR: Estimated Creatinine Clearance: 72.2 mL/min (by C-G formula based on SCr of 0.83 mg/dL). Liver Function Tests: Recent Labs  Lab 04/01/21 1331 04/02/21 0130  AST 34 33  ALT 27 24  ALKPHOS 145* 140*  BILITOT 1.3* 1.0  PROT 6.8 7.0  ALBUMIN 2.1* 2.1*   No results for input(s): LIPASE, AMYLASE in the last 168 hours. No results for input(s): AMMONIA in the last 168 hours. Coagulation Profile: No results for input(s): INR, PROTIME in the last 168 hours. Cardiac Enzymes: No results for input(s): CKTOTAL, CKMB, CKMBINDEX, TROPONINI in the last 168 hours. BNP (last 3 results) No results for input(s): PROBNP in the last 8760 hours. HbA1C: No results for input(s): HGBA1C in the last 72 hours.  CBG: No results for input(s): GLUCAP in the last 168 hours. Lipid Profile: No results for input(s): CHOL, HDL, LDLCALC, TRIG, CHOLHDL, LDLDIRECT in the last 72 hours. Thyroid Function Tests: No results for input(s): TSH, T4TOTAL, FREET4, T3FREE, THYROIDAB in the last 72  hours. Anemia Panel: No results for input(s): VITAMINB12, FOLATE, FERRITIN, TIBC, IRON, RETICCTPCT in the last 72 hours. Urine analysis:    Component Value Date/Time   COLORURINE YELLOW 04/15/2015 [place] 04/15/2015 1143   LABSPEC 1.015 04/15/2015 1143   PHURINE 6.0 04/15/2015 1143   GLUCOSEU NEGATIVE 04/15/2015 1143   HGBUR SMALL (A) 04/15/2015 1143   BILIRUBINUR NEGATIVE 04/15/2015 1143   KETONESUR NEGATIVE 04/15/2015 1143   PROTEINUR 30 (A) 04/15/2015 1143   UROBILINOGEN 0.2 04/15/2015 1143   NITRITE NEGATIVE 04/15/2015 1143   LEUKOCYTESUR TRACE (A) 04/15/2015 1143   Sepsis Labs: '@LABRCNTIP'$ (procalcitonin:4,lacticidven:4)  ) Recent Results (from the past 240 hour(s))  Resp Panel by RT-PCR (Flu A&B, Covid) Nasopharyngeal Swab     Status: None   Collection Time: 04/01/21  5:32 PM   Specimen: Nasopharyngeal Swab; Nasopharyngeal(NP) swabs in vial transport medium  Result Value Ref Range Status   SARS Coronavirus 2 by RT PCR NEGATIVE NEGATIVE Final    Comment: (NOTE) SARS-CoV-2 target nucleic acids are NOT DETECTED.  The SARS-CoV-2 RNA is generally detectable in upper respiratory specimens during  the acute phase of infection. The lowest concentration of SARS-CoV-2 viral copies this assay can detect is 138 copies/mL. A negative result does not preclude SARS-Cov-2 infection and should not be used as the sole basis for treatment or other patient management decisions. A negative result may occur with  improper specimen collection/handling, submission of specimen other than nasopharyngeal swab, presence of viral mutation(s) within the areas targeted by this assay, and inadequate number of viral copies(<138 copies/mL). A negative result must be combined with clinical observations, patient history, and epidemiological information. The expected result is Negative.  Fact Sheet for Patients:  EntrepreneurPulse.com.au  Fact Sheet for Healthcare  Providers:  IncredibleEmployment.be  This test is no t yet approved or cleared by the [place] FDA and  has been authorized for detection and/or diagnosis of SARS-CoV-2 by FDA under an Emergency Use Authorization (EUA). This EUA will remain  in effect (meaning this test can be used) for the duration of the COVID-19 declaration under Section 564(b)(1) of the Act, 21 U.S.C.section 360bbb-3(b)(1), unless the authorization is terminated  or revoked sooner.       Influenza A by PCR NEGATIVE NEGATIVE Final   Influenza B by PCR NEGATIVE NEGATIVE Final    Comment: (NOTE) The Xpert Xpress SARS-CoV-2/FLU/RSV plus assay is intended as an aid in the diagnosis of influenza from Nasopharyngeal swab specimens and should not be used as a sole basis for treatment. Nasal washings and aspirates are unacceptable for Xpert Xpress SARS-CoV-2/FLU/RSV testing.  Fact Sheet for Patients: EntrepreneurPulse.com.au  Fact Sheet for Healthcare Providers: IncredibleEmployment.be  This test is not yet approved or cleared by the [place] FDA and has been authorized for detection and/or diagnosis of SARS-CoV-2 by FDA under an Emergency Use Authorization (EUA). This EUA will remain in effect (meaning this test can be used) for the duration of the COVID-19 declaration under Section 564(b)(1) of the Act, 21 U.S.C. section 360bbb-3(b)(1), unless the authorization is terminated or revoked.  Performed at [facility], [place] [address], [place], [place] [address]   Culture, blood (Routine X 2) w Reflex to ID Panel     Status: None (Preliminary result)   Collection Time: 04/03/21 10:01 AM   Specimen: BLOOD LEFT HAND  Result Value Ref Range Status   Specimen Description BLOOD LEFT HAND  Final   Special Requests   Final    BOTTLES DRAWN AEROBIC ONLY Blood Culture results may not be optimal due to an inadequate volume of blood received in culture  bottles   Culture   Final    NO GROWTH 4 DAYS Performed at [facility], [place] [address], [place], [place] [address]    Report Status PENDING  Incomplete  Culture, blood (Routine X 2) w Reflex to ID Panel     Status: None (Preliminary result)   Collection Time: 04/03/21 10:11 AM   Specimen: BLOOD RIGHT HAND  Result Value Ref Range Status   Specimen Description BLOOD RIGHT HAND  Final   Special Requests   Final    BOTTLES DRAWN AEROBIC ONLY Blood Culture results may not be optimal due to an inadequate volume of blood received in culture bottles   Culture   Final    NO GROWTH 4 DAYS Performed at [facility], [place] [address], [place], [place] [address]    Report Status PENDING  Incomplete    Radiology Studies: No results found.  Scheduled Meds:  (feeding supplement) PROSource Plus  30 mL Oral BID BM  acetaminophen  1,000 mg Oral Q6H   vitamin C  250 mg Oral BID   Chlorhexidine Gluconate Cloth  6 each Topical Daily   collagenase   Topical Daily   docusate sodium  100 mg Oral Daily   enoxaparin (LOVENOX) injection  40 mg Subcutaneous Q24H   feeding supplement  1 Container Oral TID BM   feeding supplement  237 mL Oral TID BM   methocarbamol  500 mg Oral TID   multivitamin with minerals  1 tablet Oral Daily   zinc sulfate  220 mg Oral Daily   Continuous Infusions:  linezolid (ZYVOX) IV 600 mg (04/07/21 0602)   piperacillin-tazobactam (ZOSYN)  IV 3.375 g (04/07/21 1027)     LOS: 6 days    Time spent: 83mn  PDomenic Polite MD [facility]   04/07/2021, 12:32 PM

## 2021-04-07 NOTE — Progress Notes (Addendum)
Physical Therapy Wound Evaluation and Treatment Patient Details  Name: Abigail Beck MRN: [id number] Date of Birth: 1961-08-29  Today's Date: 04/07/2021 Time: 0911-1005 Time Calculation (min): 54 min  Subjective  Subjective Assessment Subjective: Will you be gentle? Patient and Family Stated Goals: to heal wounds Date of Onset: 04/01/21 (I&D done) Prior Treatments: I&D; saline dressings  Pain Score:    Wound Assessment  Wound / Incision (Open or Dehisced) 04/01/21 Other (Comment) Left malodorous; purluent and serosangenous drainage (Active)  Dressing Type ABD;Gauze (Comment);Mesh briefs;Moist to moist;Normal saline moist dressing 04/07/21 1000  Dressing Changed Changed 04/07/21 1000  Dressing Status Clean;Dry;Intact 04/07/21 1000  Dressing Change Frequency Twice a day 04/07/21 1000  Site / Wound Assessment Black;Brown;Granulation tissue;Red;Yellow 04/07/21 1000  % Wound base Red or Granulating 70% 04/07/21 1000  % Wound base Yellow/Fibrinous Exudate 25% 04/07/21 1000  % Wound base Black/Eschar 5% 04/07/21 1000  Peri-wound Assessment Intact 04/07/21 1000  Wound Length (cm) 13.6 cm 04/07/21 1000  Wound Width (cm) 5.8 cm 04/07/21 1000  Wound Depth (cm) 1.6 cm 04/07/21 1000  Wound Volume (cm^3) 126.21 cm^3 04/07/21 1000  Wound Surface Area (cm^2) 78.88 cm^2 04/07/21 1000  Margins Unattached edges (unapproximated) 04/07/21 1000  Drainage Amount Copious 04/07/21 1000  Drainage Description Serosanguineous 04/07/21 1000  Treatment Debridement (Selective);Hydrotherapy (Pulse lavage);Packing (Saline gauze) 04/07/21 1000     Wound / Incision (Open or Dehisced) 04/01/21 Incision - Open Labia Right anterior to posterior/buttock (Active)  Wound Image    04/05/21 0500  Dressing Type ABD;Gauze (Comment);Mesh briefs;Moist to moist;Normal saline moist dressing 04/05/21 0500  Dressing Changed Changed 04/05/21 0500  Dressing Status Clean;Dry;Intact 04/05/21 0500  Dressing Change  Frequency Twice a day 04/05/21 0500  Site / Wound Assessment Clean;Granulation tissue;Red;Yellow;Brown;Black 04/05/21 0500  % Wound base Red or Granulating 70% 04/05/21 0500  % Wound base Yellow/Fibrinous Exudate 25% 04/05/21 0500  % Wound base Black/Eschar 5% 04/05/21 0500  Peri-wound Assessment Intact 04/05/21 0500  Wound Length (cm) 30 cm 04/05/21 0500  Wound Width (cm) 6 cm 04/05/21 0500  Wound Depth (cm) 2 cm 04/05/21 0500  Wound Volume (cm^3) 360 cm^3 04/05/21 0500  Wound Surface Area (cm^2) 180 cm^2 04/05/21 0500  Margins Unattached edges (unapproximated) 04/05/21 0500  Drainage Amount Copious 04/05/21 0500  Drainage Description Serosanguineous 04/05/21 0500  Treatment Debridement (Selective);Hydrotherapy (Pulse lavage);Packing (Saline gauze) 04/05/21 0500     Wound / Incision (Open or Dehisced) 04/01/21 Incision - Open Buttocks Right (Active)  Dressing Type ABD;Gauze (Comment);Mesh briefs;Moist to moist;Normal saline moist dressing 04/07/21 1100  Dressing Changed Changed 04/07/21 1100  Dressing Status Clean;Dry;Intact 04/07/21 1100  Dressing Change Frequency Twice a day 04/07/21 1100  Site / Wound Assessment Bleeding;Red;Yellow 04/07/21 1100  % Wound base Red or Granulating 60% 04/07/21 1100  % Wound base Yellow/Fibrinous Exudate 40% 04/07/21 1100  % Wound base Black/Eschar 0% 04/07/21 1100  Peri-wound Assessment Intact 04/07/21 1100  Wound Length (cm) 6 cm 04/07/21 1100  Wound Width (cm) 5.5 cm 04/07/21 1100  Wound Depth (cm) 0.5 cm 04/07/21 1100  Wound Volume (cm^3) 16.5 cm^3 04/07/21 1100  Wound Surface Area (cm^2) 33 cm^2 04/07/21 1100  Margins Unattached edges (unapproximated) 04/07/21 1100  Drainage Amount Copious 04/07/21 1100  Drainage Description Serosanguineous 04/07/21 1100  Treatment Debridement (Selective);Hydrotherapy (Pulse lavage);Packing (Saline gauze) 04/07/21 1100   Hydrotherapy Pulsed lavage therapy - wound location: bil Labia, rt buttock  wounds Pulsed Lavage with Suction (psi): 4 psi Pulsed Lavage with Suction - Normal Saline Used: 2000 mL Pulsed Lavage  Tip: Tip with splash shield Selective Debridement Selective Debridement - Location: bil labia, rt buttock Selective Debridement - Tools Used: Forceps, Scissors Selective Debridement - Tissue Removed: yellow, brown necrotic tissue/slough    Wound Assessment and Plan  Wound Therapy - Assess/Plan/Recommendations Wound Therapy - Clinical Statement: Patient admitted with Fourniere's gangrene and s/p I&D 04/01/21. Wounds with remaining necrotic tissue varying from 25-40% and can benefit from hydrotherapy to reduce necrotic tissue, reduce bioburden, and promote healing. Patient currently has both foley catheter and flexiseal to reduce contamination of wounds which is very helpful. Noted plans to pursue SNF for continued wound care as pt lives alone. Wound Therapy - Functional Problem List: limited mobility due to pain; limited ability to sit due to location of wounds Factors Delaying/Impairing Wound Healing: Infection - systemic/local, Immobility, Multiple medical problems Hydrotherapy Plan: Debridement, Dressing change, Patient/family education, Pulsatile lavage with suction Wound Therapy - Frequency: 6X / week Wound Therapy - Follow Up Recommendations: f/u pulsed lavage with suction, f/u selective debridement, dressing changes by RN  Wound Therapy Goals- Improve the function of patient's integumentary system by progressing the wound(s) through the phases of wound healing (inflammation - proliferation - remodeling) by: Wound Therapy Goals - Improve the function of patient's integumentary system by progressing the wound(s) through the phases of wound healing by: Decrease Necrotic Tissue to: <5% Decrease Necrotic Tissue - Progress: Goal set today Increase Granulation Tissue to: >95% Increase Granulation Tissue - Progress: Goal set today Decrease Length/Width/Depth by (cm):  0.5/0.5/0.3 Decrease Length/Width/Depth - Progress: Goal set today Improve Drainage Characteristics: Min, Serous Improve Drainage Characteristics - Progress: Goal set today Goals/treatment plan/discharge plan were made with and agreed upon by patient/family: Yes Time For Goal Achievement: 7 days Wound Therapy - Potential for Goals: Good  Goals will be updated until maximal potential achieved or discharge criteria met.  Discharge criteria: when goals achieved, discharge from hospital, MD decision/surgical intervention, no progress towards goals, refusal/missing three consecutive treatments without notification or medical reason.  GP     Charges PT Wound Care Charges $Wound Debridement up to 20 cm: < or equal to 20 cm $ Wound Debridement each add'l 20 sqcm: 4 $PT PLS Gun and Tip: 1 Supply $PT Hydrotherapy Visit: 2 Visits      Arby Barrette, PT Pager [phone]   Rexanne Mano 04/07/2021, 11:26 AM

## 2021-04-07 NOTE — Progress Notes (Signed)
Nurse at bedside to change dressings.  Pt. Reports that she is not feeling up to dressing changes at this time.  She has refused the dressing changes at this time. Patient would like to wait for PT to start Hydrotherapy.  I educated patient about the importance of the dressing changes.  She refuses at this time.

## 2021-04-08 LAB — BASIC METABOLIC PANEL
Anion gap: 5 (ref 5–15)
BUN: 9 mg/dL (ref 6–20)
CO2: 27 mmol/L (ref 22–32)
Calcium: 8.1 mg/dL — ABNORMAL LOW (ref 8.9–10.3)
Chloride: 99 mmol/L (ref 98–111)
Creatinine, Ser: 0.93 mg/dL (ref 0.44–1.00)
GFR, Estimated: >60 mL/min (ref >60)
Glucose, Bld: 207 mg/dL — ABNORMAL HIGH (ref 70–99)
Potassium: 4.8 mmol/L (ref 3.5–5.1)
Sodium: 131 mmol/L — ABNORMAL LOW (ref 135–145)

## 2021-04-08 LAB — VITAMIN B12: Vitamin B-12: 1142 pg/mL — ABNORMAL HIGH (ref 180–914)

## 2021-04-08 LAB — CBC
HCT: 27.7 % — ABNORMAL LOW (ref 36.0–46.0)
Hemoglobin: 9.3 g/dL — ABNORMAL LOW (ref 12.0–15.0)
MCH: 30 pg (ref 26.0–34.0)
MCHC: 33.6 g/dL (ref 30.0–36.0)
MCV: 89.4 fL (ref 80.0–100.0)
Platelets: 217 10*3/uL (ref 150–400)
RBC: 3.1 MIL/uL — ABNORMAL LOW (ref 3.87–5.11)
RDW: 14.1 % (ref 11.5–15.5)
WBC: 9.1 10*3/uL (ref 4.0–10.5)
nRBC: 0 % (ref 0.0–0.2)

## 2021-04-08 LAB — IRON AND TIBC
Iron: 76 ug/dL (ref 28–170)
Saturation Ratios: 35 % — ABNORMAL HIGH (ref 10.4–31.8)
TIBC: 216 ug/dL — ABNORMAL LOW (ref 250–450)
UIBC: 140 ug/dL

## 2021-04-08 LAB — RETICULOCYTES
Immature Retic Fract: 19.8 % — ABNORMAL HIGH (ref 2.3–15.9)
RBC.: 3.09 MIL/uL — ABNORMAL LOW (ref 3.87–5.11)
Retic Count, Absolute: 131 10*3/uL (ref 19.0–186.0)
Retic Ct Pct: 4.2 % — ABNORMAL HIGH (ref 0.4–3.1)

## 2021-04-08 LAB — FERRITIN: Ferritin: 40 ng/mL (ref 11–307)

## 2021-04-08 LAB — FOLATE: Folate: 13.8 ng/mL (ref >5.9)

## 2021-04-08 MED ORDER — SODIUM CHLORIDE 0.9 % IV SOLN
250.0000 mg | Freq: Every day | INTRAVENOUS | Status: AC
  Administered 2021-04-08 – 2021-04-09 (×2): 250 mg via INTRAVENOUS
  Filled 2021-04-08 (×2): qty 20

## 2021-04-08 NOTE — Progress Notes (Addendum)
Progress Note  7 Days Post-Op  Subjective: Up to chair with some pain. Tolerating diet - was having some nausea but improved with medication.   Objective: Vital signs in last 24 hours: Temp:  [98.1 F (36.7 C)-98.4 F (36.9 C)] 98.4 F (36.9 C) (08/08 0731) Pulse Rate:  [102-110] 110 (08/08 0731) Resp:  [16-18] 18 (08/08 0731) BP: (109-112)/(60-71) 112/65 (08/08 0731) SpO2:  [98 %-99 %] 98 % (08/08 0731) Last BM Date: 04/07/21  Intake/Output from previous day: 08/07 0701 - 08/08 0700 In: 170 [P.O.:120; IV Piggyback:50] Out: [id number] [Urine:1450; Stool:1100] Intake/Output this shift: No intake/output data recorded.  PE: General: pleasant, WD, female who is sitting up in chair in NAD HEENT: head is normocephalic. Heart: regular, rate, and rhythm. Palpable radial pulses bilaterally Lungs: Respiratory effort nonlabored on room air Abd: soft, NT, ND MSK: all 4 extremities are symmetrical with no cyanosis, clubbing, or edema. No calf TTP GU: perineal/buttock wound with dressing c/d/I  Skin: warm and dry. Left axilla with open shallow draining abscess with improving granulation and small amount fibrinous exudate Psych: A&Ox3 with an appropriate affect.   Lab Results:  Recent Labs    04/07/21 0638 04/08/21 0541  WBC 11.0* 9.1  HGB 9.8* 9.3*  HCT 28.8* 27.7*  PLT 226 217    BMET Recent Labs    04/07/21 0638 04/08/21 0541  NA 131* 131*  K 4.1 4.8  CL 102 99  CO2 24 27  GLUCOSE 133* 207*  BUN 8 9  CREATININE 0.83 0.93  CALCIUM 8.1* 8.1*    PT/INR No results for input(s): LABPROT, INR in the last 72 hours. CMP     Component Value Date/Time   NA 131 (L) 04/08/2021 0541   K 4.8 04/08/2021 0541   CL 99 04/08/2021 0541   CO2 27 04/08/2021 0541   GLUCOSE 207 (H) 04/08/2021 0541   BUN 9 04/08/2021 0541   CREATININE 0.93 04/08/2021 0541   CREATININE 0.88 04/19/2020 1125   CALCIUM 8.1 (L) 04/08/2021 0541   PROT 7.0 04/02/2021 0130   ALBUMIN 2.1 (L) 04/02/2021  0130   AST 33 04/02/2021 0130   AST 86 (H) 04/19/2020 1125   ALT 24 04/02/2021 0130   ALT 77 (H) 04/19/2020 1125   ALKPHOS 140 (H) 04/02/2021 0130   BILITOT 1.0 04/02/2021 0130   BILITOT 0.5 04/19/2020 1125   GFRNONAA >60 04/08/2021 0541   GFRNONAA >60 04/19/2020 1125   GFRAA >60 04/19/2020 1125   Lipase     Component Value Date/Time   LIPASE 17 (L) 04/15/2015 0805       Studies/Results: No results found.  Anti-infectives: Anti-infectives (From admission, onward)    Start     Dose/Rate Route Frequency Ordered Stop   04/01/21 2330  piperacillin-tazobactam (ZOSYN) IVPB 3.375 g  Status:  Discontinued        3.375 g 100 mL/hr over 30 Minutes Intravenous Every 8 hours 04/01/21 2230 04/01/21 2238   04/01/21 1800  linezolid (ZYVOX) IVPB 600 mg        600 mg 300 mL/hr over 60 Minutes Intravenous Every 12 hours 04/01/21 1741     04/01/21 1800  piperacillin-tazobactam (ZOSYN) IVPB 3.375 g        3.375 g 12.5 mL/hr over 240 Minutes Intravenous Every 8 hours 04/01/21 1743     04/01/21 1745  clindamycin (CLEOCIN) IVPB 900 mg  Status:  Discontinued        900 mg 100 mL/hr over 30 Minutes Intravenous  Once 04/01/21 1731 04/01/21 1741   04/01/21 1745  piperacillin-tazobactam (ZOSYN) IVPB 3.375 g  Status:  Discontinued        3.375 g 12.5 mL/hr over 240 Minutes Intravenous Every 8 hours 04/01/21 1741 04/01/21 1743        Assessment/Plan POD 7, s/p incision and debridement of necrotizing soft tissue infection of perineum, perianal field and genitalia with EUA by Dr. Kae Heller on 8/1  Multiple abscess - scalp, L axilla, bilateral LEs - surgical path - with acute inflammation and necrosis. No malignancy - follow blood cultures (collected after abx initiation) - NGTD - OR findings of rectal involvement with inflammation and irregularity - will need an outpatient colonoscopy and may need diverting ostomy given amount of stool leakage - wd dressing changes to all wounds  - hydroPT to  perineal wound - santyl to left thigh wound for eschar - monitor scalp abscesses - continue foley catheter for now, continue flexiseal due to wound location and risk for further contamination  Will assess perineal wound further today at time of hydrotherapy  FEN: regular, ensure breeze, IVF ID: linezolid, zosyn VTE: lovenox Foley: 8/1, to remain for now  AKI Chronic hep C/cirrhosis Hyperglycemia - HgbA1c 5.6 Crohn's disease    LOS: 7 days    Winferd Humphrey, [facility] Surgery 04/08/2021, 8:59 AM Please see Amion for pager number during day hours 7:00am-4:30pm

## 2021-04-08 NOTE — Progress Notes (Signed)
Nutrition Follow-up  DOCUMENTATION CODES:   Not applicable  INTERVENTION:   Continue Ensure Enlive po TID, each supplement provides 350 kcal and 20 grams of protein  Continue 30 ml ProSource Plus TID, each supplement provides 100 kcals and 15 grams protein.   Liberalize diet to REGULAR, double portions of protein/entree at meals  Continue MVI with Minerals, Vitamin C, Zinc. Vitamin labs pending   NUTRITION DIAGNOSIS:   Increased nutrient needs related to wound healing as evidenced by estimated needs.  Being addressed via supplements  GOAL:   Patient will meet greater than or equal to 90% of their needs  Progressing  MONITOR:   PO intake, Supplement acceptance, Labs, Weight trends  REASON FOR ASSESSMENT:   Rounds    ASSESSMENT:   60 yo female necrotizing soft tissue infection to labia, perineum in addition to lesions to skalp, L axilla, bilateral LE and mouth. PMH includes Crohn's disease, liver chorris, vaginal abscess/boils  Pt reports appetite improving, pt ate oatmeal this AM. Recorded po intake 25-100% of meals  Pt reports she is taking the Pro-Source supplement. Pt also taking Boost Breeze and/or Ensure Enlive; pt reports she tolerates both. Recommend the Ensure over Boost Breeze given increased nutrition in the Ensure Enlive/Plus  Noted possibility of diverting colostomy; agree that this would decrease likelihood of contamination and improve wound healing given location  Labs: sodium 131 (L) Meds: reviewed  Diet Order:  HEART HEALTHY   EDUCATION NEEDS:   Education needs have been addressed  Skin:  Skin Assessment: Skin Integrity Issues: Skin Integrity Issues:: Other (Comment) Other: necrotizing wounds to perineum, perianal and vaginal areas; lesions to scalp, l axilla, LE, mouth  Last BM:  8/8 rectal tube  Height:   Ht Readings from Last 1 Encounters:  04/01/21 '5\' 4"'$  (1.626 m)    Weight:   Wt Readings from Last 1 Encounters:  04/01/21  72.6 kg    BMI:  Body mass index is 27.47 kg/m.  Estimated Nutritional Needs:   Kcal:  2150-2350 kcals  Protein:  120-140 g  Fluid:  >/= 2 L  Kerman Passey MS, RDN, LDN, CNSC Registered Dietitian III Clinical Nutrition RD Pager and On-Call Pager Number Located in [place]

## 2021-04-08 NOTE — TOC Progression Note (Signed)
Transition of Care [facility]) - Progression Note    Patient Details  Name: Abigail Beck MRN: [id number] Date of Birth: Oct 29, 1960  Transition of Care [facility]) CM/SW Contact  Emeterio Reeve, [place] Phone Number: 04/08/2021, 12:42 PM  Clinical Narrative:     CSW gave pt and daughter bed offers. CSW reached out to pending facilities in the hub. Pt and daughter will review offers and make a decision about SNF.   CSW will follow up.  Expected Discharge Plan: [place] Barriers to Discharge: Continued Medical Work up, Ship broker  Expected Discharge Plan and Services Expected Discharge Plan: [place] arrangements for the past 2 months: Single Family Home                                       Social Determinants of Health (SDOH) Interventions    Readmission Risk Interventions No flowsheet data found.  Emeterio Reeve, Latanya Presser, [place] Social Worker [phone]

## 2021-04-08 NOTE — Progress Notes (Signed)
PROGRESS NOTE    Abigail Beck  [id number] DOB: 1961-01-26 DOA: 04/01/2021 PCP: Beverley Fiedler, FNP  Brief Narrative: 60 year old female with history of Crohn's disease, liver cirrhosis reported history of vaginal abscesses/boils with ruptured over 1 week, she was started on oral doxycycline, despite this continued to have increased odor and discharge, also history of intermittent rectal bleeding. -In the ED she was noted to have a white count of 21K, creatinine of 1.4, CT was concerning for Fournier's gangrene involving labia and perineum with extensive soft tissue changes -Underwent extensive incision and debridement of necrotizing soft tissue by Dr. Windle Guard on 8/1   Assessment & Plan:   Fournier's gangrene Sepsis Poa -Underwent extensive incision and debridement of necrotizing soft tissue of the perineum and perianal field and genitalia by Dr. Windle Guard 8/1 -Continue wound care -Has a rectal tube and Foley catheter currently to prevent contamination -Continue broad-spectrum antibiotics-Zyvox and Zosyn day 8,   no OR cultures -Per CCS, started hydrotherapy -Discontinue antibiotics when okay with CCS -Anticipate need for SNF for wound care  Left axillary wound Multiple furuncular skin lesions -Appears like disseminated staph infection, blood cultures are negative -Continue wound care to axillary wound, wound bed appears clean now -Continue Zyvox  Large mucosal ulcer, left buccal mucosa -Etiology is unclear, has a smaller ulcer on the palate as well -Suspect this could be secondary to Crohn's disease -Appreciate ENT input -recently started seeing a GI MD again, lost to FU in the past  Acute on chronic anemia - worsening in the setting of chronic blood loss from wounds -Anemia panel suggestive of chronic disease, given ongoing oozing from large wound will give IV iron  Acute kidney injury -Secondary to sepsis, improving -Off IV fluids  History of chronic hep  C/cirrhosis -Imaging consistent with cirrhosis, follow-up with gastroenterology -Per chart review, known history of liver fibrosis was supposed to have a liver biopsy after hep C treatment which apparently did not happen  Moderate protein calorie malnutrition/hypoalbuminemia -Cirrhosis also contributing, protein supplements  Hyperglycemia -hemoglobin A1c is 5.6  History of Crohn's disease -Not on active treatment at this time -Suspect oral ulcers could be related to Crohn's disease, she started seeing a new gastroenterologist Dr. Eber Jones at [facility], due to have a colonoscopy in the near future   DVT prophylaxis: Lovenox Code Status: Full code Family Communication: Daughter at bedside Disposition Plan:  Status is: Inpatient  Remains inpatient appropriate because:Inpatient level of care appropriate due to severity of illness  Dispo: The patient is from: Home              Anticipated d/c is to: SNF              Patient currently is not medically stable to d/c.   Difficult to place patient No  Consultants:  General surgery  Procedures:     8/1 Surgeon: Clovis Riley MD   Procedure performed: Incision and debridement of necrotizing soft tissue infection of the perineum, perianal field and genitalia 875cm; examination under anesthesia   Debridement type: Excisional Debridement  Subjective: -Started hydrotherapy yesterday, sitting up in the recliner today  Objective: Vitals:   04/07/21 2004 04/08/21 0526 04/08/21 0731 04/08/21 1215  BP: 109/60 109/65 112/65 (!) 92/50  Pulse: (!) 102 (!) 105 (!) 110 89  Resp: '17 16 18 16  '$ Temp: 98.4 F (36.9 C) 98.1 F (36.7 C) 98.4 F (36.9 C) 98.7 F (37.1 C)  TempSrc: Oral Oral Oral Oral  SpO2: 99% 98% 98%  98%  Weight:      Height:        Intake/Output Summary (Last 24 hours) at 04/08/2021 1404 Last data filed at 04/08/2021 1018 Gross per 24 hour  Intake 290 ml  Output 1750 ml  Net -1460 ml   Filed Weights    04/01/21 1700  Weight: 72.6 kg    Examination:  General exam: Gen: Awake, Alert, Oriented X 3,  L axillary wound bed is clean now Lungs: Clear anteriorly CVS: S1S2/RRR Abd: soft, Non tender, non distended, BS present Extremities: No edema Skin: no new rashes on exposed skin  Perineum, buttocks with large wound with bleeding/oozing, granulation tissue and fat Skin: As above  Psychiatry:  Mood & affect appropriate.     Data Reviewed:   CBC: Recent Labs  Lab 04/02/21 0130 04/03/21 0059 04/05/21 0104 04/07/21 0638 04/08/21 0541  WBC 31.4* 15.7* 11.7* 11.0* 9.1  HGB 11.5* 10.5* 10.1* 9.8* 9.3*  HCT 33.5* 31.5* 29.4* 28.8* 27.7*  MCV 85.9 86.3 85.5 87.5 89.4  PLT 239 215 190 226 [id number]   Basic Metabolic Panel: Recent Labs  Lab 04/02/21 0130 04/03/21 0059 04/05/21 0104 04/07/21 0638 04/08/21 0541  NA 135 133* 133* 131* 131*  K 3.9 3.5 2.9* 4.1 4.8  CL 106 101 102 102 99  CO2 19* '24 22 24 27  '$ GLUCOSE 150* 107* 98 133* 207*  BUN 26* 25* '10 8 9  '$ CREATININE 1.18* 1.21* 0.88 0.83 0.93  CALCIUM 9.1 8.8* 7.9* 8.1* 8.1*   GFR: Estimated Creatinine Clearance: 64.4 mL/min (by C-G formula based on SCr of 0.93 mg/dL). Liver Function Tests: Recent Labs  Lab 04/02/21 0130  AST 33  ALT 24  ALKPHOS 140*  BILITOT 1.0  PROT 7.0  ALBUMIN 2.1*   No results for input(s): LIPASE, AMYLASE in the last 168 hours. No results for input(s): AMMONIA in the last 168 hours. Coagulation Profile: No results for input(s): INR, PROTIME in the last 168 hours. Cardiac Enzymes: No results for input(s): CKTOTAL, CKMB, CKMBINDEX, TROPONINI in the last 168 hours. BNP (last 3 results) No results for input(s): PROBNP in the last 8760 hours. HbA1C: No results for input(s): HGBA1C in the last 72 hours.  CBG: No results for input(s): GLUCAP in the last 168 hours. Lipid Profile: No results for input(s): CHOL, HDL, LDLCALC, TRIG, CHOLHDL, LDLDIRECT in the last 72 hours. Thyroid Function  Tests: No results for input(s): TSH, T4TOTAL, FREET4, T3FREE, THYROIDAB in the last 72 hours. Anemia Panel: Recent Labs    04/08/21 0541  VITAMINB12 1,142*  FOLATE 13.8  FERRITIN 40  TIBC 216*  IRON 76  RETICCTPCT 4.2*   Urine analysis:    Component Value Date/Time   COLORURINE YELLOW 04/15/2015 1143   APPEARANCEUR CLEAR 04/15/2015 1143   LABSPEC 1.015 04/15/2015 1143   PHURINE 6.0 04/15/2015 1143   GLUCOSEU NEGATIVE 04/15/2015 1143   HGBUR SMALL (A) 04/15/2015 1143   BILIRUBINUR NEGATIVE 04/15/2015 1143   KETONESUR NEGATIVE 04/15/2015 1143   PROTEINUR 30 (A) 04/15/2015 1143   UROBILINOGEN 0.2 04/15/2015 1143   NITRITE NEGATIVE 04/15/2015 1143   LEUKOCYTESUR TRACE (A) 04/15/2015 1143   Sepsis Labs: '@LABRCNTIP'$ (procalcitonin:4,lacticidven:4)  ) Recent Results (from the past 240 hour(s))  Resp Panel by RT-PCR (Flu A&B, Covid) Nasopharyngeal Swab     Status: None   Collection Time: 04/01/21  5:32 PM   Specimen: Nasopharyngeal Swab; Nasopharyngeal(NP) swabs in vial transport medium  Result Value Ref Range Status   SARS Coronavirus 2 by RT PCR  NEGATIVE NEGATIVE Final    Comment: (NOTE) SARS-CoV-2 target nucleic acids are NOT DETECTED.  The SARS-CoV-2 RNA is generally detectable in upper respiratory specimens during the acute phase of infection. The lowest concentration of SARS-CoV-2 viral copies this assay can detect is 138 copies/mL. A negative result does not preclude SARS-Cov-2 infection and should not be used as the sole basis for treatment or other patient management decisions. A negative result may occur with  improper specimen collection/handling, submission of specimen other than nasopharyngeal swab, presence of viral mutation(s) within the areas targeted by this assay, and inadequate number of viral copies(<138 copies/mL). A negative result must be combined with clinical observations, patient history, and epidemiological information. The expected result is  Negative.  Fact Sheet for Patients:  EntrepreneurPulse.com.au  Fact Sheet for Healthcare Providers:  IncredibleEmployment.be  This test is no t yet approved or cleared by the [place] FDA and  has been authorized for detection and/or diagnosis of SARS-CoV-2 by FDA under an Emergency Use Authorization (EUA). This EUA will remain  in effect (meaning this test can be used) for the duration of the COVID-19 declaration under Section 564(b)(1) of the Act, 21 U.S.C.section 360bbb-3(b)(1), unless the authorization is terminated  or revoked sooner.       Influenza A by PCR NEGATIVE NEGATIVE Final   Influenza B by PCR NEGATIVE NEGATIVE Final    Comment: (NOTE) The Xpert Xpress SARS-CoV-2/FLU/RSV plus assay is intended as an aid in the diagnosis of influenza from Nasopharyngeal swab specimens and should not be used as a sole basis for treatment. Nasal washings and aspirates are unacceptable for Xpert Xpress SARS-CoV-2/FLU/RSV testing.  Fact Sheet for Patients: EntrepreneurPulse.com.au  Fact Sheet for Healthcare Providers: IncredibleEmployment.be  This test is not yet approved or cleared by the [place] FDA and has been authorized for detection and/or diagnosis of SARS-CoV-2 by FDA under an Emergency Use Authorization (EUA). This EUA will remain in effect (meaning this test can be used) for the duration of the COVID-19 declaration under Section 564(b)(1) of the Act, 21 U.S.C. section 360bbb-3(b)(1), unless the authorization is terminated or revoked.  Performed at [facility], [place] [address], [place], [place] [address]   Culture, blood (Routine X 2) w Reflex to ID Panel     Status: None (Preliminary result)   Collection Time: 04/03/21 10:01 AM   Specimen: BLOOD LEFT HAND  Result Value Ref Range Status   Specimen Description BLOOD LEFT HAND  Final   Special Requests   Final    BOTTLES DRAWN  AEROBIC ONLY Blood Culture results may not be optimal due to an inadequate volume of blood received in culture bottles   Culture   Final    NO GROWTH 4 DAYS Performed at [facility], [place] [address], [place], [place] [address]    Report Status PENDING  Incomplete  Culture, blood (Routine X 2) w Reflex to ID Panel     Status: None (Preliminary result)   Collection Time: 04/03/21 10:11 AM   Specimen: BLOOD RIGHT HAND  Result Value Ref Range Status   Specimen Description BLOOD RIGHT HAND  Final   Special Requests   Final    BOTTLES DRAWN AEROBIC ONLY Blood Culture results may not be optimal due to an inadequate volume of blood received in culture bottles   Culture   Final    NO GROWTH 4 DAYS Performed at [facility], [place] [address], [place],  [address]    Report  Status PENDING  Incomplete    Radiology Studies: No results found.  Scheduled Meds:  (feeding supplement) PROSource Plus  30 mL Oral BID BM   acetaminophen  1,000 mg Oral Q6H   vitamin C  250 mg Oral BID   Chlorhexidine Gluconate Cloth  6 each Topical Daily   collagenase   Topical Daily   docusate sodium  100 mg Oral Daily   enoxaparin (LOVENOX) injection  40 mg Subcutaneous Q24H   feeding supplement  237 mL Oral TID BM   methocarbamol  500 mg Oral TID   multivitamin with minerals  1 tablet Oral Daily   zinc sulfate  220 mg Oral Daily   Continuous Infusions:  ferric gluconate (FERRLECIT) IVPB     linezolid (ZYVOX) IV Stopped (04/08/21 0659)   piperacillin-tazobactam (ZOSYN)  IV 3.375 g (04/08/21 0957)     LOS: 7 days    Time spent: 69mn  PDomenic Polite MD [facility]   04/08/2021, 2:04 PM

## 2021-04-08 NOTE — Progress Notes (Signed)
Physical Therapy Wound Treatment Patient Details  Name: Abigail Beck MRN: [id number] Date of Birth: 1961-04-08  Today's Date: 04/08/2021 Time: 9937-1696 Time Calculation (min): 58 min  Subjective  Subjective Assessment Subjective: Pleasant and agreeable to hydrotherapy. Patient and Family Stated Goals: to heal wounds Date of Onset: 04/01/21 (I&D done) Prior Treatments: I&D; saline dressings  Pain Score:  Premedicated and tolerated treatment well.   Wound Assessment  Wound / Incision (Open or Dehisced) 04/01/21 Other (Comment) Left Buttocks (Active)  Dressing Type ABD;Barrier Film (skin prep);Gauze (Comment);Moist to moist;Santyl 04/08/21 1458  Dressing Changed Changed 04/08/21 1458  Dressing Status Clean;Dry;Intact 04/08/21 1458  Dressing Change Frequency Daily 04/08/21 1458  Site / Wound Assessment Black;Brown;Granulation tissue;Red;Yellow 04/07/21 1000  % Wound base Red or Granulating 70% 04/08/21 1458  % Wound base Yellow/Fibrinous Exudate 25% 04/08/21 1458  % Wound base Black/Eschar 5% 04/08/21 1458  % Wound base Other/Granulation Tissue (Comment) 0% 04/08/21 1458  Peri-wound Assessment Intact 04/08/21 1458  Wound Length (cm) 13.6 cm 04/07/21 1000  Wound Width (cm) 5.8 cm 04/07/21 1000  Wound Depth (cm) 1.6 cm 04/07/21 1000  Wound Volume (cm^3) 126.21 cm^3 04/07/21 1000  Wound Surface Area (cm^2) 78.88 cm^2 04/07/21 1000  Tunneling (cm) 0 04/08/21 1458  Undermining (cm) 0 04/08/21 1458  Margins Unattached edges (unapproximated) 04/08/21 1458  Closure None 04/08/21 1458  Drainage Amount Copious 04/08/21 1458  Drainage Description Serosanguineous 04/08/21 1458  Non-staged Wound Description Full thickness 04/08/21 1458  Treatment Debridement (Selective);Hydrotherapy (Pulse lavage);Packing (Saline gauze) 04/08/21 1458     Wound / Incision (Open or Dehisced) 04/01/21 Incision - Open Labia Right anterior to posterior/buttock (Active)  Dressing Type ABD;Barrier Film  (skin prep);Gauze (Comment);Moist to moist;Santyl 04/08/21 1458  Dressing Changed Changed 04/08/21 1458  Dressing Status Clean;Dry;Intact 04/08/21 1458  Dressing Change Frequency Daily 04/08/21 1458  Site / Wound Assessment Red;Yellow;Black 04/08/21 1458  % Wound base Red or Granulating 70% 04/08/21 1458  % Wound base Yellow/Fibrinous Exudate 25% 04/08/21 1458  % Wound base Black/Eschar 5% 04/08/21 1458  % Wound base Other/Granulation Tissue (Comment) 0% 04/08/21 1458  Peri-wound Assessment Intact 04/08/21 1458  Wound Length (cm) 30 cm 04/07/21 1024  Wound Width (cm) 6 cm 04/07/21 1024  Wound Depth (cm) 2 cm 04/07/21 1024  Wound Volume (cm^3) 360 cm^3 04/07/21 1024  Wound Surface Area (cm^2) 180 cm^2 04/07/21 1024  Tunneling (cm) 0 04/08/21 1458  Undermining (cm) 0 04/08/21 1458  Margins Unattached edges (unapproximated) 04/08/21 1458  Closure None 04/08/21 1458  Drainage Amount Copious 04/08/21 1458  Drainage Description Serosanguineous 04/08/21 1458  Non-staged Wound Description Full thickness 04/08/21 1458  Treatment Debridement (Selective);Hydrotherapy (Pulse lavage);Packing (Saline gauze) 04/08/21 1458     Wound / Incision (Open or Dehisced) 04/01/21 Incision - Open Buttocks Right (Active)  Dressing Type ABD;Barrier Film (skin prep);Gauze (Comment);Moist to moist;Santyl 04/08/21 1458  Dressing Changed Changed 04/08/21 1458  Dressing Status Clean;Dry;Intact 04/08/21 1458  Dressing Change Frequency Daily 04/08/21 1458  Site / Wound Assessment Red;Yellow;Black 04/08/21 1458  % Wound base Red or Granulating 60% 04/08/21 1458  % Wound base Yellow/Fibrinous Exudate 30% 04/08/21 1458  % Wound base Black/Eschar 10% 04/08/21 1458  % Wound base Other/Granulation Tissue (Comment) 0% 04/08/21 1458  Peri-wound Assessment Intact 04/08/21 1458  Wound Length (cm) 6 cm 04/07/21 1100  Wound Width (cm) 5.5 cm 04/07/21 1100  Wound Depth (cm) 0.5 cm 04/07/21 1100  Wound Volume (cm^3) 16.5 cm^3  04/07/21 1100  Wound Surface Area (cm^2) 33 cm^2 04/07/21 1100  Tunneling (cm) 0 04/08/21 1458  Undermining (cm) 0 04/08/21 1458  Margins Unattached edges (unapproximated) 04/08/21 1458  Closure None 04/08/21 1458  Drainage Amount Copious 04/08/21 1458  Drainage Description Serosanguineous 04/08/21 1458  Non-staged Wound Description Full thickness 04/08/21 1458  Treatment Debridement (Selective);Hydrotherapy (Pulse lavage);Packing (Saline gauze) 04/08/21 1458   Hydrotherapy Pulsed lavage therapy - wound location: bilateral labia and buttock wounds Pulsed Lavage with Suction (psi): 4 psi Pulsed Lavage with Suction - Normal Saline Used: 2000 mL Pulsed Lavage Tip: Tip with splash shield Selective Debridement Selective Debridement - Location: Bilateral labia and buttock wounds Selective Debridement - Tools Used: Forceps, Scissors Selective Debridement - Tissue Removed: yellow, black/brown necrotic tissue/slough    Wound Assessment and Plan  Wound Therapy - Assess/Plan/Recommendations Wound Therapy - Clinical Statement: Wounds assessed with Saverio Danker, PA-C. It appears wounds are improving with hydrotherapy and debridement was able to progress this session. This patient will benefit from continued hydrotherapy for selective removal of unviable tissue, to decrease bioburden, and promote wound bed healing. Wound Therapy - Functional Problem List: limited mobility due to pain; limited ability to sit due to location of wounds Factors Delaying/Impairing Wound Healing: Infection - systemic/local, Immobility, Multiple medical problems Hydrotherapy Plan: Debridement, Dressing change, Patient/family education, Pulsatile lavage with suction Wound Therapy - Frequency: 6X / week Wound Therapy - Follow Up Recommendations: dressing changes by RN  Wound Therapy Goals- Improve the function of patient's integumentary system by progressing the wound(s) through the phases of wound healing (inflammation -  proliferation - remodeling) by: Wound Therapy Goals - Improve the function of patient's integumentary system by progressing the wound(s) through the phases of wound healing by: Decrease Necrotic Tissue to: <5% Decrease Necrotic Tissue - Progress: Progressing toward goal Increase Granulation Tissue to: >95% Increase Granulation Tissue - Progress: Progressing toward goal Decrease Length/Width/Depth by (cm): 0.5/0.5/0.3 Decrease Length/Width/Depth - Progress: Progressing toward goal Improve Drainage Characteristics: Min, Serous Improve Drainage Characteristics - Progress: Progressing toward goal Goals/treatment plan/discharge plan were made with and agreed upon by patient/family: Yes Time For Goal Achievement: 7 days Wound Therapy - Potential for Goals: Good  Goals will be updated until maximal potential achieved or discharge criteria met.  Discharge criteria: when goals achieved, discharge from hospital, MD decision/surgical intervention, no progress towards goals, refusal/missing three consecutive treatments without notification or medical reason.  GP     Charges PT Wound Care Charges $Wound Debridement up to 20 cm: < or equal to 20 cm $ Wound Debridement each add'l 20 sqcm: 4 $PT PLS Gun and Tip: 1 Supply $PT Hydrotherapy Visit: 2 Visits       Thelma Comp 04/08/2021, 3:09 PM  Rolinda Roan, PT, DPT Acute Rehabilitation Services Pager: [phone] Office: [phone]

## 2021-04-09 LAB — CBC
HCT: 26.7 % — ABNORMAL LOW (ref 36.0–46.0)
Hemoglobin: 8.9 g/dL — ABNORMAL LOW (ref 12.0–15.0)
MCH: 29.9 pg (ref 26.0–34.0)
MCHC: 33.3 g/dL (ref 30.0–36.0)
MCV: 89.6 fL (ref 80.0–100.0)
Platelets: 201 10*3/uL (ref 150–400)
RBC: 2.98 MIL/uL — ABNORMAL LOW (ref 3.87–5.11)
RDW: 14.4 % (ref 11.5–15.5)
WBC: 7.9 10*3/uL (ref 4.0–10.5)
nRBC: 0.4 % — ABNORMAL HIGH (ref 0.0–0.2)

## 2021-04-09 LAB — CULTURE, BLOOD (ROUTINE X 2)
Culture: NO GROWTH
Culture: NO GROWTH

## 2021-04-09 MED ORDER — DOXYCYCLINE HYCLATE 100 MG PO TABS
100.0000 mg | ORAL_TABLET | Freq: Two times a day (BID) | ORAL | Status: DC
  Administered 2021-04-09 – 2021-04-11 (×5): 100 mg via ORAL
  Filled 2021-04-09 (×5): qty 1

## 2021-04-09 NOTE — Progress Notes (Signed)
PROGRESS NOTE    Abigail Beck  [id number] DOB: 07-Apr-1961 DOA: 04/01/2021 PCP: Beverley Fiedler, FNP  Brief Narrative: 60 year old female with history of Crohn's disease, liver cirrhosis reported history of vaginal abscesses/boils with ruptured over 1 week, she was started on oral doxycycline, despite this continued to have increased odor and discharge, also history of intermittent rectal bleeding. -In the ED she was noted to have a white count of 21K, creatinine of 1.4, CT was concerning for Fournier's gangrene involving labia and perineum with extensive soft tissue changes -Underwent extensive incision and debridement of necrotizing soft tissue by Dr. Windle Guard on 8/1   Assessment & Plan:   Fournier's gangrene Sepsis Poa -Underwent extensive incision and debridement of necrotizing soft tissue of the perineum and perianal field and genitalia by Dr. Windle Guard 8/1 -Continue wound care -Has a rectal tube and Foley catheter currently to prevent contamination, anticipate she may need this for another week or 2 -has been on broad-spectrum antibiotics-Zyvox and Zosyn day 9,   no OR cultures -Clinically improving, afebrile, leukocytosis has resolved, will discontinue IV antibiotics and transition to oral doxycycline for 7 days -Per [facility], started hydrotherapy 8/7 -Plan for at least another day of hydrotherapy per [facility] -Dispo unclear yet, LTAC versus SNF, TOC following  Left axillary wound Multiple furuncular skin lesions -Appears like disseminated staph infection, blood cultures are negative -Continue wound care to axillary wound, wound bed is clean now -Changing Zyvox to doxycycline today, continue this for 7 more days  Large mucosal ulcer, left buccal mucosa -Etiology is unclear, has a smaller ulcer on the palate as well -Suspect this could be secondary to Crohn's disease -Appreciate ENT input -recently started seeing a GI MD again-she is due to have a colonoscopy in the near  future, lost to FU in the past  Acute on chronic anemia - worsening in the setting of chronic blood loss from wounds -Anemia panel suggestive of chronic disease, given ongoing oozing from large wound, given IV iron yesterday -Continue to trend  Acute kidney injury -Secondary to sepsis, improving -Off IV fluids  History of chronic hep C/cirrhosis -Imaging consistent with cirrhosis, follow-up with gastroenterology -Per chart review, known history of liver fibrosis was supposed to have a liver biopsy after hep C treatment which apparently did not happen  Moderate protein calorie malnutrition/hypoalbuminemia -Cirrhosis also contributing, continue protein supplements  Hyperglycemia -hemoglobin A1c is 5.6  History of Crohn's disease -Not on active treatment at this time -Suspect oral ulcers could be related to Crohn's disease, she started seeing a new gastroenterologist Dr. Eber Jones at [facility], due to have a colonoscopy in the near future   DVT prophylaxis: Lovenox Code Status: Full code Family Communication: Daughter at bedside yesterday Disposition Plan:  Status is: Inpatient  Remains inpatient appropriate because:Inpatient level of care appropriate due to severity of illness  Dispo: The patient is from: Home              Anticipated d/c is to: SNF              Patient currently is not medically stable to d/c.   Difficult to place patient No  Consultants:  General surgery  Procedures:     8/1 Surgeon: Clovis Riley MD   Procedure performed: Incision and debridement of necrotizing soft tissue infection of the perineum, perianal field and genitalia 875cm; examination under anesthesia   Debridement type: Excisional Debridement  Subjective: -Feels okay, about to start hydrotherapy this morning  Objective: Vitals:  04/09/21 0753 04/09/21 0844 04/09/21 1049 04/09/21 1239  BP: 123/88  129/85 121/84  Pulse: (!) 113 (!) 110 (!) 108 (!) 116  Resp: '18  18 18   '$ Temp: 97.7 F (36.5 C)  98.4 F (36.9 C) 98.3 F (36.8 C)  TempSrc: Oral  Oral Oral  SpO2: 100%     Weight:      Height:        Intake/Output Summary (Last 24 hours) at 04/09/2021 1445 Last data filed at 04/08/2021 1900 Gross per 24 hour  Intake 263.42 ml  Output 1500 ml  Net -1236.58 ml   Filed Weights   04/01/21 1700  Weight: 72.6 kg    Examination:  General exam: Gen: Awake, Alert, Oriented X 3,  HEENT: no JVD Left axillary wound with clean base now Lungs: Good air movement bilaterally, CTAB CVS: S1S2/RRR Abd: soft, Non tender, non distended, BS present Extremities: No edema Skin: Perineum, buttocks with large wound with bleeding/oozing, granulation tissue and fat Psychiatry:  Mood & affect appropriate.     Data Reviewed:   CBC: Recent Labs  Lab 04/03/21 0059 04/05/21 0104 04/07/21 [id number] 04/08/21 0541 04/09/21 0550  WBC 15.7* 11.7* 11.0* 9.1 7.9  HGB 10.5* 10.1* 9.8* 9.3* 8.9*  HCT 31.5* 29.4* 28.8* 27.7* 26.7*  MCV 86.3 85.5 87.5 89.4 89.6  PLT 215 190 226 217 [id number]   Basic Metabolic Panel: Recent Labs  Lab 04/03/21 0059 04/05/21 0104 04/07/21 0638 04/08/21 0541  NA 133* 133* 131* 131*  K 3.5 2.9* 4.1 4.8  CL 101 102 102 99  CO2 '24 22 24 27  '$ GLUCOSE 107* 98 133* 207*  BUN 25* '10 8 9  '$ CREATININE 1.21* 0.88 0.83 0.93  CALCIUM 8.8* 7.9* 8.1* 8.1*   GFR: Estimated Creatinine Clearance: 64.4 mL/min (by C-G formula based on SCr of 0.93 mg/dL). Liver Function Tests: No results for input(s): AST, ALT, ALKPHOS, BILITOT, PROT, ALBUMIN in the last 168 hours.  No results for input(s): LIPASE, AMYLASE in the last 168 hours. No results for input(s): AMMONIA in the last 168 hours. Coagulation Profile: No results for input(s): INR, PROTIME in the last 168 hours. Cardiac Enzymes: No results for input(s): CKTOTAL, CKMB, CKMBINDEX, TROPONINI in the last 168 hours. BNP (last 3 results) No results for input(s): PROBNP in the last 8760 hours. HbA1C: No  results for input(s): HGBA1C in the last 72 hours.  CBG: No results for input(s): GLUCAP in the last 168 hours. Lipid Profile: No results for input(s): CHOL, HDL, LDLCALC, TRIG, CHOLHDL, LDLDIRECT in the last 72 hours. Thyroid Function Tests: No results for input(s): TSH, T4TOTAL, FREET4, T3FREE, THYROIDAB in the last 72 hours. Anemia Panel: Recent Labs    04/08/21 0541  VITAMINB12 1,142*  FOLATE 13.8  FERRITIN 40  TIBC 216*  IRON 76  RETICCTPCT 4.2*   Urine analysis:    Component Value Date/Time   COLORURINE YELLOW 04/15/2015 1143   APPEARANCEUR CLEAR 04/15/2015 1143   LABSPEC 1.015 04/15/2015 1143   PHURINE 6.0 04/15/2015 1143   GLUCOSEU NEGATIVE 04/15/2015 1143   HGBUR SMALL (A) 04/15/2015 1143   BILIRUBINUR NEGATIVE 04/15/2015 1143   KETONESUR NEGATIVE 04/15/2015 1143   PROTEINUR 30 (A) 04/15/2015 1143   UROBILINOGEN 0.2 04/15/2015 1143   NITRITE NEGATIVE 04/15/2015 1143   LEUKOCYTESUR TRACE (A) 04/15/2015 1143   Sepsis Labs: '@LABRCNTIP'$ (procalcitonin:4,lacticidven:4)  ) Recent Results (from the past 240 hour(s))  Resp Panel by RT-PCR (Flu A&B, Covid) Nasopharyngeal Swab     Status: None  Collection Time: 04/01/21  5:32 PM   Specimen: Nasopharyngeal Swab; Nasopharyngeal(NP) swabs in vial transport medium  Result Value Ref Range Status   SARS Coronavirus 2 by RT PCR NEGATIVE NEGATIVE Final    Comment: (NOTE) SARS-CoV-2 target nucleic acids are NOT DETECTED.  The SARS-CoV-2 RNA is generally detectable in upper respiratory specimens during the acute phase of infection. The lowest concentration of SARS-CoV-2 viral copies this assay can detect is 138 copies/mL. A negative result does not preclude SARS-Cov-2 infection and should not be used as the sole basis for treatment or other patient management decisions. A negative result may occur with  improper specimen collection/handling, submission of specimen other than nasopharyngeal swab, presence of viral  mutation(s) within the areas targeted by this assay, and inadequate number of viral copies(<138 copies/mL). A negative result must be combined with clinical observations, patient history, and epidemiological information. The expected result is Negative.  Fact Sheet for Patients:  EntrepreneurPulse.com.au  Fact Sheet for Healthcare Providers:  IncredibleEmployment.be  This test is no t yet approved or cleared by the [place] FDA and  has been authorized for detection and/or diagnosis of SARS-CoV-2 by FDA under an Emergency Use Authorization (EUA). This EUA will remain  in effect (meaning this test can be used) for the duration of the COVID-19 declaration under Section 564(b)(1) of the Act, 21 U.S.C.section 360bbb-3(b)(1), unless the authorization is terminated  or revoked sooner.       Influenza A by PCR NEGATIVE NEGATIVE Final   Influenza B by PCR NEGATIVE NEGATIVE Final    Comment: (NOTE) The Xpert Xpress SARS-CoV-2/FLU/RSV plus assay is intended as an aid in the diagnosis of influenza from Nasopharyngeal swab specimens and should not be used as a sole basis for treatment. Nasal washings and aspirates are unacceptable for Xpert Xpress SARS-CoV-2/FLU/RSV testing.  Fact Sheet for Patients: EntrepreneurPulse.com.au  Fact Sheet for Healthcare Providers: IncredibleEmployment.be  This test is not yet approved or cleared by the [place] FDA and has been authorized for detection and/or diagnosis of SARS-CoV-2 by FDA under an Emergency Use Authorization (EUA). This EUA will remain in effect (meaning this test can be used) for the duration of the COVID-19 declaration under Section 564(b)(1) of the Act, 21 U.S.C. section 360bbb-3(b)(1), unless the authorization is terminated or revoked.  Performed at [facility], [place] [address], [place], [place] [address]   Culture, blood (Routine X 2) w  Reflex to ID Panel     Status: None   Collection Time: 04/03/21 10:01 AM   Specimen: BLOOD LEFT HAND  Result Value Ref Range Status   Specimen Description BLOOD LEFT HAND  Final   Special Requests   Final    BOTTLES DRAWN AEROBIC ONLY Blood Culture results may not be optimal due to an inadequate volume of blood received in culture bottles   Culture   Final    NO GROWTH 6 DAYS Performed at [facility], [place] [address], [place], [place] [address]    Report Status 04/09/2021 FINAL  Final  Culture, blood (Routine X 2) w Reflex to ID Panel     Status: None   Collection Time: 04/03/21 10:11 AM   Specimen: BLOOD RIGHT HAND  Result Value Ref Range Status   Specimen Description BLOOD RIGHT HAND  Final   Special Requests   Final    BOTTLES DRAWN AEROBIC ONLY Blood Culture results may not be optimal due to an inadequate volume of blood received in culture bottles  Culture   Final    NO GROWTH 6 DAYS Performed at [facility], [place] [address] [place], [place] [address]    Report Status 04/09/2021 FINAL  Final    Radiology Studies: No results found.  Scheduled Meds:  (feeding supplement) PROSource Plus  30 mL Oral BID BM   acetaminophen  1,000 mg Oral Q6H   vitamin C  250 mg Oral BID   Chlorhexidine Gluconate Cloth  6 each Topical Daily   collagenase   Topical Daily   docusate sodium  100 mg Oral Daily   enoxaparin (LOVENOX) injection  40 mg Subcutaneous Q24H   feeding supplement  237 mL Oral TID BM   methocarbamol  500 mg Oral TID   multivitamin with minerals  1 tablet Oral Daily   zinc sulfate  220 mg Oral Daily   Continuous Infusions:  linezolid (ZYVOX) IV 600 mg (04/09/21 0516)   piperacillin-tazobactam (ZOSYN)  IV 3.375 g (04/09/21 0936)     LOS: 8 days   Time spent: 63mn  PDomenic Polite MD [facility]   04/09/2021, 2:45 PM

## 2021-04-09 NOTE — Care Management (Addendum)
Left Arley Phenix with [facility] a message , await call back.   Raquel Sarna with [facility] will also review clinicals.   [place] Per Anderson Malta with [facility] . Commercial insurance patients must have at least 3 ICU days to qualify for [facility] placement. Patient does not have ICU days therefore does not qualify for LTACH.   Ward with [facility] called. For commercial insurance patient does NOT need 3 ICU night stay.  1400 NCM called Jennifer back. Anderson Malta confirmed patient does not need ICU stay for [facility].   Discussed [facility] and [facility] with patient and daughter at bedside. Both in agreement.  [facility] unable to offer a bed. Raquel Sarna with [facility] offered a bed pending insurance authorization. PAtient and daughter accepted [facility]. Raquel Sarna with [facility] will begin insurance auth.

## 2021-04-09 NOTE — Progress Notes (Signed)
Progress Note  8 Days Post-Op  Subjective: Doing well this am. Pain well controlled and improving appetite. No nausea or emesis.  Objective: Vital signs in last 24 hours: Temp:  [97.9 F (36.6 C)-98.7 F (37.1 C)] 97.9 F (36.6 C) (08/09 0604) Pulse Rate:  [88-110] 110 (08/09 0604) Resp:  [16-18] 18 (08/09 0604) BP: (92-116)/(49-84) 111/70 (08/09 0604) SpO2:  [97 %-100 %] 100 % (08/09 0604) Last BM Date: 04/08/21  Intake/Output from previous day: 08/08 0701 - 08/09 0700 In: 503.4 [P.O.:240; IV Piggyback:263.4] Out: 1700 [Urine:1200; Stool:500] Intake/Output this shift: No intake/output data recorded.  PE: General: pleasant, WD, female who is sitting up in bed in NAD HEENT: head is normocephalic. Scalp abscesses with small amount draining purulence. Lesions to buccal mucosa and hard palate significantly improved Heart: regular, rate, and rhythm. Palpable radial pulses bilaterally Lungs: Respiratory effort nonlabored on room air Abd: soft, NT, ND MSK: all 4 extremities are symmetrical with no cyanosis, clubbing, or edema. No calf TTP GU: perineal/buttock wound with dressing c/d/I  Skin: warm and dry. Left axilla with open shallow draining abscess with improving granulation and small amount fibrinous exudate. Left upper thigh wound with improving granulation tissue. Right lower thigh wound with small amount exudate without erythema or purulence Psych: A&Ox3 with an appropriate affect.   Lab Results:  Recent Labs    04/08/21 0541 04/09/21 0550  WBC 9.1 7.9  HGB 9.3* 8.9*  HCT 27.7* 26.7*  PLT 217 201    BMET Recent Labs    04/07/21 0638 04/08/21 0541  NA 131* 131*  K 4.1 4.8  CL 102 99  CO2 24 27  GLUCOSE 133* 207*  BUN 8 9  CREATININE 0.83 0.93  CALCIUM 8.1* 8.1*    PT/INR No results for input(s): LABPROT, INR in the last 72 hours. CMP     Component Value Date/Time   NA 131 (L) 04/08/2021 0541   K 4.8 04/08/2021 0541   CL 99 04/08/2021 0541    CO2 27 04/08/2021 0541   GLUCOSE 207 (H) 04/08/2021 0541   BUN 9 04/08/2021 0541   CREATININE 0.93 04/08/2021 0541   CREATININE 0.88 04/19/2020 1125   CALCIUM 8.1 (L) 04/08/2021 0541   PROT 7.0 04/02/2021 0130   ALBUMIN 2.1 (L) 04/02/2021 0130   AST 33 04/02/2021 0130   AST 86 (H) 04/19/2020 1125   ALT 24 04/02/2021 0130   ALT 77 (H) 04/19/2020 1125   ALKPHOS 140 (H) 04/02/2021 0130   BILITOT 1.0 04/02/2021 0130   BILITOT 0.5 04/19/2020 1125   GFRNONAA >60 04/08/2021 0541   GFRNONAA >60 04/19/2020 1125   GFRAA >60 04/19/2020 1125   Lipase     Component Value Date/Time   LIPASE 17 (L) 04/15/2015 0805       Studies/Results: No results found.  Anti-infectives: Anti-infectives (From admission, onward)    Start     Dose/Rate Route Frequency Ordered Stop   04/01/21 2330  piperacillin-tazobactam (ZOSYN) IVPB 3.375 g  Status:  Discontinued        3.375 g 100 mL/hr over 30 Minutes Intravenous Every 8 hours 04/01/21 2230 04/01/21 2238   04/01/21 1800  linezolid (ZYVOX) IVPB 600 mg        600 mg 300 mL/hr over 60 Minutes Intravenous Every 12 hours 04/01/21 1741     04/01/21 1800  piperacillin-tazobactam (ZOSYN) IVPB 3.375 g        3.375 g 12.5 mL/hr over 240 Minutes Intravenous Every 8 hours 04/01/21 1743  04/01/21 1745  clindamycin (CLEOCIN) IVPB 900 mg  Status:  Discontinued        900 mg 100 mL/hr over 30 Minutes Intravenous  Once 04/01/21 1731 04/01/21 1741   04/01/21 1745  piperacillin-tazobactam (ZOSYN) IVPB 3.375 g  Status:  Discontinued        3.375 g 12.5 mL/hr over 240 Minutes Intravenous Every 8 hours 04/01/21 1741 04/01/21 1743        Assessment/Plan POD 8, s/p incision and debridement of necrotizing soft tissue infection of perineum, perianal field and genitalia with EUA by Dr. Kae Heller on 8/1  Multiple abscess - scalp, L axilla, bilateral LEs - surgical path - with acute inflammation and necrosis. No malignancy - blood cultures (collected after abx  initiation) - No growth at 6 days - OR findings of rectal involvement with inflammation and irregularity - will need an outpatient colonoscopy  - wd dressing changes to all wounds  - hydroPT to perineal wound for today and tomorrow at least - santyl to left thigh wound for eschar - monitor scalp abscesses - continue foley catheter, continue flexiseal due to wound location and risk for further contamination   FEN: regular, ensure breeze, IVF ID: linezolid, zosyn VTE: lovenox Foley: 8/1, recommend foley remain on discharge  AKI Chronic hep C/cirrhosis Hyperglycemia - HgbA1c 5.6 Crohn's disease  Disposition: from surgical perspective, stable for discharge to SNF as early as tomorrow following hydrotherapy session. Recommend reaching out to ID given lack of positive culture and multiple sites   LOS: 8 days    Winferd Humphrey, [facility] 04/09/2021, 8:06 AM Please see Amion for pager number during day hours 7:00am-4:30pm

## 2021-04-09 NOTE — Progress Notes (Signed)
Physical Therapy Wound Treatment Patient Details  Name: Abigail Beck MRN: [id number] Date of Birth: 05-Oct-1960  Today's Date: 04/09/2021 Time: 1100-1222 Time Calculation (min): 82 min  Subjective  Subjective Assessment Subjective: Pleasant and agreeable to hydrotherapy. Patient and Family Stated Goals: to heal wounds Date of Onset: 04/01/21 (I&D done) Prior Treatments: I&D; saline dressings  Pain Score:  Pt tolerated treatment fairly well with premedication  Wound Assessment  Wound / Incision (Open or Dehisced) 04/01/21 Other (Comment) Left Buttocks (Active)  Dressing Type ABD;Barrier Film (skin prep);Gauze (Comment);Moist to moist;Santyl 04/09/21 1252  Dressing Changed Changed 04/09/21 1252  Dressing Status Clean;Dry;Intact 04/09/21 1252  Dressing Change Frequency Daily 04/09/21 1252  Site / Wound Assessment Granulation tissue;Red;Yellow;Black 04/09/21 1252  % Wound base Red or Granulating 80% 04/09/21 1252  % Wound base Yellow/Fibrinous Exudate 15% 04/09/21 1252  % Wound base Black/Eschar 5% 04/09/21 1252  % Wound base Other/Granulation Tissue (Comment) 0% 04/09/21 1252  Peri-wound Assessment Intact 04/09/21 1252  Wound Length (cm) 13.6 cm 04/07/21 1000  Wound Width (cm) 5.8 cm 04/07/21 1000  Wound Depth (cm) 1.6 cm 04/07/21 1000  Wound Volume (cm^3) 126.21 cm^3 04/07/21 1000  Wound Surface Area (cm^2) 78.88 cm^2 04/07/21 1000  Tunneling (cm) 0 04/08/21 1458  Undermining (cm) 0 04/08/21 1458  Margins Unattached edges (unapproximated) 04/09/21 1252  Closure None 04/09/21 1252  Drainage Amount Copious 04/09/21 1252  Drainage Description Serosanguineous 04/09/21 1252  Non-staged Wound Description Full thickness 04/09/21 1252  Treatment Debridement (Selective);Hydrotherapy (Pulse lavage);Packing (Saline gauze) 04/09/21 1252     Wound / Incision (Open or Dehisced) 04/01/21 Non-pressure wound Thigh Right;Left (Active)  Dressing Type Gauze (Comment);Tape dressing;Moist  to moist;Normal saline moist dressing 04/07/21 2045  Dressing Changed Changed 04/08/21 0244  Dressing Status Clean 04/07/21 2045  Site / Wound Assessment Pink 04/09/21 0753  Drainage Amount None 04/09/21 0753     Wound / Incision (Open or Dehisced) 04/01/21 Incision - Open Labia Right anterior to posterior/buttock (Active)  Dressing Type ABD;Barrier Film (skin prep);Gauze (Comment);Moist to moist;Santyl 04/09/21 1252  Dressing Changed Changed 04/09/21 1252  Dressing Status Clean;Dry;Intact 04/09/21 1252  Dressing Change Frequency Daily 04/09/21 1252  Site / Wound Assessment Red;Yellow 04/09/21 1252  % Wound base Red or Granulating 80% 04/09/21 1252  % Wound base Yellow/Fibrinous Exudate 20% 04/09/21 1252  % Wound base Black/Eschar 0% 04/09/21 1252  % Wound base Other/Granulation Tissue (Comment) 0% 04/09/21 1252  Peri-wound Assessment Intact 04/09/21 1252  Wound Length (cm) 30 cm 04/07/21 1024  Wound Width (cm) 6 cm 04/07/21 1024  Wound Depth (cm) 2 cm 04/07/21 1024  Wound Volume (cm^3) 360 cm^3 04/07/21 1024  Wound Surface Area (cm^2) 180 cm^2 04/07/21 1024  Tunneling (cm) 0 04/08/21 1458  Undermining (cm) 0 04/08/21 1458  Margins Unattached edges (unapproximated) 04/09/21 1252  Closure None 04/09/21 1252  Drainage Amount Copious 04/09/21 1252  Drainage Description Serosanguineous 04/09/21 1252  Non-staged Wound Description Full thickness 04/09/21 1252  Treatment Debridement (Selective);Hydrotherapy (Pulse lavage);Packing (Saline gauze) 04/09/21 1252     Wound / Incision (Open or Dehisced) 04/01/21 Incision - Open Buttocks Right (Active)  Dressing Type ABD;Barrier Film (skin prep);Gauze (Comment);Moist to moist;Santyl 04/09/21 1252  Dressing Changed Changed 04/09/21 1252  Dressing Status Clean;Dry;Intact 04/09/21 1252  Dressing Change Frequency Daily 04/09/21 1252  Site / Wound Assessment Red;Yellow;Black 04/09/21 1252  % Wound base Red or Granulating 60% 04/09/21 1252  % Wound  base Yellow/Fibrinous Exudate 30% 04/09/21 1252  % Wound base Black/Eschar 10% 04/09/21 1252  %  Wound base Other/Granulation Tissue (Comment) 0% 04/09/21 1252  Peri-wound Assessment Intact 04/09/21 1252  Wound Length (cm) 6 cm 04/07/21 1100  Wound Width (cm) 5.5 cm 04/07/21 1100  Wound Depth (cm) 0.5 cm 04/07/21 1100  Wound Volume (cm^3) 16.5 cm^3 04/07/21 1100  Wound Surface Area (cm^2) 33 cm^2 04/07/21 1100  Tunneling (cm) 0 04/08/21 1458  Undermining (cm) 0 04/08/21 1458  Margins Unattached edges (unapproximated) 04/09/21 1252  Closure None 04/09/21 1252  Drainage Amount Copious 04/09/21 1252  Drainage Description Serosanguineous 04/09/21 1252  Non-staged Wound Description Full thickness 04/09/21 1252  Treatment Debridement (Selective);Hydrotherapy (Pulse lavage);Packing (Saline gauze) 04/09/21 1252   Hydrotherapy Pulsed lavage therapy - wound location: bilateral labia and buttock wounds Pulsed Lavage with Suction (psi): 4 psi Pulsed Lavage with Suction - Normal Saline Used: 2000 mL Pulsed Lavage Tip: Tip with splash shield Selective Debridement Selective Debridement - Location: Bilateral labia and buttock wounds Selective Debridement - Tools Used: Forceps, Scissors Selective Debridement - Tissue Removed: yellow, black/brown necrotic tissue/slough    Wound Assessment and Plan  Wound Therapy - Assess/Plan/Recommendations Wound Therapy - Clinical Statement: It appears wounds are improving with hydrotherapy and debridement was able to progress this session. This patient will benefit from continued hydrotherapy for selective removal of unviable tissue, to decrease bioburden, and promote wound bed healing. Wound Therapy - Functional Problem List: limited mobility due to pain; limited ability to sit due to location of wounds Factors Delaying/Impairing Wound Healing: Infection - systemic/local, Immobility, Multiple medical problems Hydrotherapy Plan: Debridement, Dressing change,  Patient/family education, Pulsatile lavage with suction Wound Therapy - Frequency: 6X / week Wound Therapy - Follow Up Recommendations: dressing changes by RN  Wound Therapy Goals- Improve the function of patient's integumentary system by progressing the wound(s) through the phases of wound healing (inflammation - proliferation - remodeling) by: Wound Therapy Goals - Improve the function of patient's integumentary system by progressing the wound(s) through the phases of wound healing by: Decrease Necrotic Tissue to: <5% Decrease Necrotic Tissue - Progress: Progressing toward goal Increase Granulation Tissue to: >95% Increase Granulation Tissue - Progress: Progressing toward goal Decrease Length/Width/Depth by (cm): 0.5/0.5/0.3 Decrease Length/Width/Depth - Progress: Progressing toward goal Improve Drainage Characteristics: Min, Serous Improve Drainage Characteristics - Progress: Progressing toward goal Goals/treatment plan/discharge plan were made with and agreed upon by patient/family: Yes Time For Goal Achievement: 7 days Wound Therapy - Potential for Goals: Good  Goals will be updated until maximal potential achieved or discharge criteria met.  Discharge criteria: when goals achieved, discharge from hospital, MD decision/surgical intervention, no progress towards goals, refusal/missing three consecutive treatments without notification or medical reason.  GP     Charges PT Wound Care Charges $Wound Debridement up to 20 cm: < or equal to 20 cm $ Wound Debridement each add'l 20 sqcm: 4 $PT PLS Gun and Tip: 1 Supply $PT Hydrotherapy Visit: 2 Visits       Thelma Comp 04/09/2021, 1:22 PM  Rolinda Roan, PT, DPT Acute Rehabilitation Services Pager: [phone] Office: [phone]

## 2021-04-10 DIAGNOSIS — D500 Iron deficiency anemia secondary to blood loss (chronic): Secondary | ICD-10-CM

## 2021-04-10 LAB — BASIC METABOLIC PANEL
Anion gap: 7 (ref 5–15)
BUN: 9 mg/dL (ref 6–20)
CO2: 25 mmol/L (ref 22–32)
Calcium: 8.3 mg/dL — ABNORMAL LOW (ref 8.9–10.3)
Chloride: 98 mmol/L (ref 98–111)
Creatinine, Ser: 0.82 mg/dL (ref 0.44–1.00)
GFR, Estimated: >60 mL/min (ref >60)
Glucose, Bld: 108 mg/dL — ABNORMAL HIGH (ref 70–99)
Potassium: 4.2 mmol/L (ref 3.5–5.1)
Sodium: 130 mmol/L — ABNORMAL LOW (ref 135–145)

## 2021-04-10 LAB — CBC
HCT: 26.3 % — ABNORMAL LOW (ref 36.0–46.0)
Hemoglobin: 8.6 g/dL — ABNORMAL LOW (ref 12.0–15.0)
MCH: 29.1 pg (ref 26.0–34.0)
MCHC: 32.7 g/dL (ref 30.0–36.0)
MCV: 88.9 fL (ref 80.0–100.0)
Platelets: 212 10*3/uL (ref 150–400)
RBC: 2.96 MIL/uL — ABNORMAL LOW (ref 3.87–5.11)
RDW: 14.4 % (ref 11.5–15.5)
WBC: 7.7 10*3/uL (ref 4.0–10.5)
nRBC: 0.8 % — ABNORMAL HIGH (ref 0.0–0.2)

## 2021-04-10 MED ORDER — COLLAGENASE 250 UNIT/GM EX OINT: TOPICAL_OINTMENT | Freq: Two times a day (BID) | CUTANEOUS | 0 refills | Status: AC

## 2021-04-10 MED ORDER — METHOCARBAMOL 500 MG PO TABS: 500.0000 mg | ORAL_TABLET | Freq: Four times a day (QID) | ORAL | 0 refills | Status: AC | PRN

## 2021-04-10 MED ORDER — ACETAMINOPHEN 500 MG PO TABS: 1000.0000 mg | ORAL_TABLET | Freq: Four times a day (QID) | ORAL | 0 refills | Status: AC

## 2021-04-10 MED ORDER — IBUPROFEN 200 MG PO TABS: 800.0000 mg | ORAL_TABLET | Freq: Three times a day (TID) | ORAL | 0 refills | Status: AC | PRN

## 2021-04-10 MED ORDER — CEFDINIR 300 MG PO CAPS
300.0000 mg | ORAL_CAPSULE | Freq: Two times a day (BID) | ORAL | Status: DC
  Administered 2021-04-10 – 2021-04-11 (×3): 300 mg via ORAL
  Filled 2021-04-10 (×4): qty 1

## 2021-04-10 MED ORDER — OXYCODONE HCL 10 MG PO TABS: 10.0000 mg | ORAL_TABLET | ORAL | 0 refills | Status: AC | PRN

## 2021-04-10 NOTE — Progress Notes (Addendum)
Progress Note  9 Days Post-Op  Subjective: No complaints this am. Tolerating diet and increasing time OOB and activity  Objective: Vital signs in last 24 hours: Temp:  [97.8 F (36.6 C)-98.4 F (36.9 C)] 98 F (36.7 C) (08/10 0432) Pulse Rate:  [107-116] 110 (08/10 0432) Resp:  [16-20] 20 (08/10 0432) BP: (113-140)/(69-85) 113/70 (08/10 0432) SpO2:  [100 %] 100 % (08/10 0432) Last BM Date: 04/09/21  Intake/Output from previous day: 08/09 0701 - 08/10 0700 In: -  Out: 675 [Urine:675] Intake/Output this shift: No intake/output data recorded.  PE: General: pleasant, WD, female who is laying in bed in NAD HEENT: head is normocephalic. Scalp abscesses with scant old discharge without fluctuance Lungs: Respiratory effort nonlabored on room air MSK: no edema or cyanosis of bilateral upper and lower extremities. Moving all extremities normally GU: perineal/buttock wound with some stool contaminant but otherwise good granulation tissue, some fibrinous exudate, no necrosis. No surrounding induration or erythema Skin: warm and dry. Bilateral thigh wounds improved and with granulation tissue. Wound of left axilla with granulation tissue and very limited fibrinous exudate and without necrosis, erythema, or induration Psych: A&Ox3 with an appropriate affect.       Lab Results:  Recent Labs    04/09/21 0550 04/10/21 0619  WBC 7.9 7.7  HGB 8.9* 8.6*  HCT 26.7* 26.3*  PLT 201 212    BMET Recent Labs    04/08/21 0541 04/10/21 0619  NA 131* 130*  K 4.8 4.2  CL 99 98  CO2 27 25  GLUCOSE 207* 108*  BUN 9 9  CREATININE 0.93 0.82  CALCIUM 8.1* 8.3*    PT/INR No results for input(s): LABPROT, INR in the last 72 hours. CMP     Component Value Date/Time   NA 130 (L) 04/10/2021 0619   K 4.2 04/10/2021 0619   CL 98 04/10/2021 0619   CO2 25 04/10/2021 0619   GLUCOSE 108 (H) 04/10/2021 0619   BUN 9 04/10/2021 0619   CREATININE 0.82 04/10/2021 0619   CREATININE 0.88  04/19/2020 1125   CALCIUM 8.3 (L) 04/10/2021 0619   PROT 7.0 04/02/2021 0130   ALBUMIN 2.1 (L) 04/02/2021 0130   AST 33 04/02/2021 0130   AST 86 (H) 04/19/2020 1125   ALT 24 04/02/2021 0130   ALT 77 (H) 04/19/2020 1125   ALKPHOS 140 (H) 04/02/2021 0130   BILITOT 1.0 04/02/2021 0130   BILITOT 0.5 04/19/2020 1125   GFRNONAA >60 04/10/2021 0619   GFRNONAA >60 04/19/2020 1125   GFRAA >60 04/19/2020 1125   Lipase     Component Value Date/Time   LIPASE 17 (L) 04/15/2015 0805       Studies/Results: No results found.  Anti-infectives: Anti-infectives (From admission, onward)    Start     Dose/Rate Route Frequency Ordered Stop   04/10/21 1000  cefdinir (OMNICEF) capsule 300 mg        300 mg Oral Every 12 hours 04/10/21 0747     04/09/21 1545  doxycycline (VIBRA-TABS) tablet 100 mg        100 mg Oral Every 12 hours 04/09/21 1446 04/16/21 0959   04/01/21 2330  piperacillin-tazobactam (ZOSYN) IVPB 3.375 g  Status:  Discontinued        3.375 g 100 mL/hr over 30 Minutes Intravenous Every 8 hours 04/01/21 2230 04/01/21 2238   04/01/21 1800  linezolid (ZYVOX) IVPB 600 mg  Status:  Discontinued        600 mg 300 mL/hr over  60 Minutes Intravenous Every 12 hours 04/01/21 1741 04/09/21 1446   04/01/21 1800  piperacillin-tazobactam (ZOSYN) IVPB 3.375 g  Status:  Discontinued        3.375 g 12.5 mL/hr over 240 Minutes Intravenous Every 8 hours 04/01/21 1743 04/09/21 1446   04/01/21 1745  clindamycin (CLEOCIN) IVPB 900 mg  Status:  Discontinued        900 mg 100 mL/hr over 30 Minutes Intravenous  Once 04/01/21 1731 04/01/21 1741   04/01/21 1745  piperacillin-tazobactam (ZOSYN) IVPB 3.375 g  Status:  Discontinued        3.375 g 12.5 mL/hr over 240 Minutes Intravenous Every 8 hours 04/01/21 1741 04/01/21 1743        Assessment/Plan POD 9, s/p incision and debridement of necrotizing soft tissue infection of perineum, perianal field and genitalia with EUA by Dr. Kae Heller on 8/1  Multiple  abscess - scalp, L axilla, bilateral LEs - surgical path - with acute inflammation and necrosis. No malignancy - blood cultures (collected after abx initiation) - No growth - OR findings of rectal involvement with inflammation and irregularity - will need an outpatient colonoscopy  - wd dressing changes to all wounds  - hydroPT to perineal wound - santyl to left thigh wound for eschar - monitor scalp abscesses - follow up with ENT outpatient for mouth lesions - they are significantly improved - continue foley catheter, continue flexiseal due to wound location and risk for further contamination  FEN: regular, ensure breeze, IVF ID: linezolid/zosyn > 8/9, doxycycline 8/9>> VTE: lovenox Foley: 8/1, recommend foley remain on discharge  AKI Chronic hep C/cirrhosis Hyperglycemia - HgbA1c 5.6 Crohn's disease  Disposition: from surgical perspective, stable for discharge to SNF today following hydrotherapy session. Follow up with [place] scheduled   LOS: 9 days    Winferd Humphrey, [facility] Surgery 04/10/2021, 8:16 AM Please see Amion for pager number during day hours 7:00am-4:30pm

## 2021-04-10 NOTE — Progress Notes (Signed)
[facility] Progress Note  Patient: Abigail Beck    [id number]  DOA: 04/01/2021     Date of Service: the patient was seen and examined on 04/10/2021  Brief hospital course: 60 year old female with history of Crohn's disease, liver cirrhosis reported history of vaginal abscesses/boils with ruptured over 1 week, she was started on oral doxycycline, despite this continued to have increased odor and discharge, also history of intermittent rectal bleeding. -In the ED she was noted to have a white count of 21K, creatinine of 1.4, CT was concerning for Fournier's gangrene involving labia and perineum with extensive soft tissue changes -Underwent extensive incision and debridement of necrotizing soft tissue by Dr. Windle Guard on 8/1  Currently plan is continue antibiotics.  Subjective: Reports pain but well controlled.  Hydrotherapy causes severe pain.  No nausea no vomiting no fever no chills.  Continues to have loose bowel movement.  No blood in the stool.    Assessment and Plan: Fournier's gangrene Sepsis Poa -Underwent extensive incision and debridement of necrotizing soft tissue of the perineum and perianal field and genitalia by Dr. Windle Guard 8/1 -Continue wound care -Has a rectal tube and Foley catheter currently to prevent contamination, anticipate she may need this for another week or 2 -has been on broad-spectrum antibiotics-Zyvox and Zosyn day 9,   no OR cultures -Clinically improving, afebrile, leukocytosis has resolved, will discontinue IV antibiotics and transition to oral doxycycline for 7 days without Omnicef.  Per ID does not require IV antibiotics as already clinically improving. -Per [facility], started hydrotherapy 8/7 -Plan for at least another day of hydrotherapy per [facility] -Dispo to LTAC.  Currently awaiting insurance authorization.   Left axillary wound Multiple furuncular skin lesions -Appears like disseminated staph infection, blood cultures are negative -Continue  wound care to axillary wound, wound bed is clean now -Changing Zyvox to doxycycline today, continue this for 7 more days   Large mucosal ulcer, left buccal mucosa -Etiology is unclear, has a smaller ulcer on the palate as well -Suspect this could be secondary to Crohn's disease -Appreciate ENT input -recently started seeing a GI MD again-she is due to have a colonoscopy in the near future, lost to FU in the past   Acute on chronic anemia - worsening in the setting of chronic blood loss from wounds -Anemia panel suggestive of chronic disease, given ongoing oozing from large wound, given IV iron yesterday -Continue to trend   Acute kidney injury -Secondary to sepsis, improving -Off IV fluids   History of chronic hep C/cirrhosis -Imaging consistent with cirrhosis, follow-up with gastroenterology -Per chart review, known history of liver fibrosis was supposed to have a liver biopsy after hep C treatment which apparently did not happen   Moderate protein calorie malnutrition/hypoalbuminemia -Cirrhosis also contributing, continue protein supplements   Hyperglycemia -hemoglobin A1c is 5.6   History of Crohn's disease -Not on active treatment at this time -Suspect oral ulcers could be related to Crohn's disease, she started seeing a new gastroenterologist Dr. Eber Jones at [place], due to have a colonoscopy in the near future  Scheduled Meds:  (feeding supplement) PROSource Plus  30 mL Oral BID BM   acetaminophen  1,000 mg Oral Q6H   vitamin C  250 mg Oral BID   cefdinir  300 mg Oral Q12H   Chlorhexidine Gluconate Cloth  6 each Topical Daily   collagenase   Topical Daily   doxycycline  100 mg Oral Q12H   enoxaparin (LOVENOX) injection  40 mg Subcutaneous Q24H  feeding supplement  237 mL Oral TID BM   methocarbamol  500 mg Oral TID   multivitamin with minerals  1 tablet Oral Daily   zinc sulfate  220 mg Oral Daily   Continuous Infusions: PRN Meds: HYDROmorphone (DILAUDID)  injection, metoprolol tartrate, ondansetron (ZOFRAN) IV, oxyCODONE, oxyCODONE, polyethylene glycol  Body mass index is 27.47 kg/m.  Nutrition Problem: Increased nutrient needs Etiology: wound healing     DVT Prophylaxis:   enoxaparin (LOVENOX) injection 40 mg Start: 04/02/21 2200    Advance goals of care discussion: Pt is Full code.  Family Communication: family was present at bedside, at the time of interview.  The pt provided permission to discuss medical plan with the family. Opportunity was given to ask question and all questions were answered satisfactorily.   Data Reviewed: I have personally reviewed and interpreted daily labs, tele strips, imaging. Mild hyponatremia.  Creatinine stable.  Hemoglobin stable.  WBC improving to normal.  Physical Exam:  General: Appear in mild distress, diffuse bites with brownish discharge, no Rash; Oral Mucosa Clear, moist. no Abnormal Neck Mass Or lumps, Conjunctiva normal  Cardiovascular: S1 and S2 Present, no Murmur, Respiratory: good respiratory effort, Bilateral Air entry present and CTA, no Crackles, no wheezes Abdomen: Bowel Sound present, Soft and no tenderness Extremities: trace Pedal edema Neurology: alert and oriented to time, place, and person affect appropriate. no new focal deficit Gait not checked due to patient safety concerns  Vitals:   04/10/21 0026 04/10/21 0432 04/10/21 0840 04/10/21 1223  BP: 125/77 113/70 116/68 133/76  Pulse: (!) 107 (!) 110 (!) 102 (!) 103  Resp: '17 20 20 20  '$ Temp: 97.9 F (36.6 C) 98 F (36.7 C) 98.1 F (36.7 C) 98 F (36.7 C)  TempSrc: Oral Oral Oral Oral  SpO2: 100% 100% 100% 100%  Weight:      Height:        Disposition:  Status is: Inpatient  Remains inpatient appropriate because:Unsafe d/c plan  Dispo: The patient is from: Home              Anticipated d/c is to: LTAC              Patient currently is not medically stable to d/c.   Difficult to place patient No  Time spent:  35 minutes. I reviewed all nursing notes, pharmacy notes, vitals, pertinent old records. I have discussed plan of care as described above with RN.  Author: Berle Mull, MD [facility] 04/10/2021 6:55 PM  To reach On-call, see care teams to locate the attending and reach out via [email or web address]. Between 7PM-7AM, please contact night-coverage If you still have difficulty reaching the attending provider, please page the [facility] (Director on Call) for [facility] on amion for assistance.

## 2021-04-10 NOTE — Progress Notes (Signed)
Physical Therapy Wound Treatment Patient Details  Name: Teriann Livingood MRN: [id number] Date of Birth: 30-Jan-1961  Today's Date: 04/10/2021 Time: 7915-0569 Time Calculation (min): 80 min  Subjective  Subjective Assessment Subjective: Pleasant and agreeable to hydrotherapy. Increased pain this session and RN notified pt requesting additional pain meds. Patient and Family Stated Goals: to heal wounds Date of Onset: 04/01/21 (I&D done) Prior Treatments: I&D; saline dressings  Pain Score:  Premedicated however still painful.   Wound Assessment  Wound / Incision (Open or Dehisced) 04/01/21 Other (Comment) Left Buttocks (Active)  Dressing Type ABD;Barrier Film (skin prep);Gauze (Comment);Moist to moist;Santyl;Mesh briefs 04/10/21 1236  Dressing Changed Changed 04/10/21 1236  Dressing Status Clean;Dry;Intact 04/10/21 1236  Dressing Change Frequency Daily 04/10/21 1236  Site / Wound Assessment Red;Yellow;Black;Brown 04/10/21 1236  % Wound base Red or Granulating 80% 04/10/21 1236  % Wound base Yellow/Fibrinous Exudate 15% 04/10/21 1236  % Wound base Black/Eschar 5% 04/10/21 1236  % Wound base Other/Granulation Tissue (Comment) 0% 04/10/21 1236  Peri-wound Assessment Intact 04/10/21 1236  Wound Length (cm) 13.6 cm 04/07/21 1000  Wound Width (cm) 5.8 cm 04/07/21 1000  Wound Depth (cm) 1.6 cm 04/07/21 1000  Wound Volume (cm^3) 126.21 cm^3 04/07/21 1000  Wound Surface Area (cm^2) 78.88 cm^2 04/07/21 1000  Tunneling (cm) 0 04/08/21 1458  Undermining (cm) 0 04/08/21 1458  Margins Unattached edges (unapproximated) 04/10/21 1236  Closure None 04/10/21 1236  Drainage Amount Copious 04/10/21 1236  Drainage Description Serosanguineous 04/10/21 1236  Non-staged Wound Description Full thickness 04/10/21 1236  Treatment Debridement (Selective);Hydrotherapy (Pulse lavage);Packing (Saline gauze) 04/10/21 1236     Wound / Incision (Open or Dehisced) 04/01/21 Incision - Open Labia Right  anterior to posterior/buttock (Active)  Dressing Type ABD;Barrier Film (skin prep);Gauze (Comment);Moist to moist;Santyl;Mesh briefs 04/10/21 1236  Dressing Changed Changed 04/10/21 1236  Dressing Status Clean;Dry;Intact 04/10/21 1236  Dressing Change Frequency Daily 04/10/21 1236  Site / Wound Assessment Red;Yellow 04/10/21 1236  % Wound base Red or Granulating 80% 04/10/21 1236  % Wound base Yellow/Fibrinous Exudate 20% 04/10/21 1236  % Wound base Black/Eschar 0% 04/10/21 1236  % Wound base Other/Granulation Tissue (Comment) 0% 04/10/21 1236  Peri-wound Assessment Intact 04/10/21 1236  Wound Length (cm) 30 cm 04/07/21 1024  Wound Width (cm) 6 cm 04/07/21 1024  Wound Depth (cm) 2 cm 04/07/21 1024  Wound Volume (cm^3) 360 cm^3 04/07/21 1024  Wound Surface Area (cm^2) 180 cm^2 04/07/21 1024  Tunneling (cm) 0 04/08/21 1458  Undermining (cm) 0 04/08/21 1458  Margins Unattached edges (unapproximated) 04/10/21 1236  Closure None 04/10/21 1236  Drainage Amount Copious 04/10/21 1236  Drainage Description Serosanguineous 04/10/21 1236  Non-staged Wound Description Full thickness 04/10/21 1236  Treatment Debridement (Selective);Hydrotherapy (Pulse lavage);Packing (Saline gauze) 04/10/21 1236     Wound / Incision (Open or Dehisced) 04/01/21 Incision - Open Buttocks Right (Active)  Dressing Type ABD;Barrier Film (skin prep);Gauze (Comment);Moist to moist;Mesh briefs;Santyl 04/10/21 1236  Dressing Changed Changed 04/10/21 1236  Dressing Status Clean;Dry;Intact 04/10/21 1236  Dressing Change Frequency Daily 04/10/21 1236  Site / Wound Assessment Red;Yellow;Black 04/10/21 1236  % Wound base Red or Granulating 60% 04/10/21 1236  % Wound base Yellow/Fibrinous Exudate 30% 04/10/21 1236  % Wound base Black/Eschar 10% 04/10/21 1236  % Wound base Other/Granulation Tissue (Comment) 0% 04/10/21 1236  Peri-wound Assessment Intact 04/10/21 1236  Wound Length (cm) 6 cm 04/07/21 1100  Wound Width (cm) 5.5  cm 04/07/21 1100  Wound Depth (cm) 0.5 cm 04/07/21 1100  Wound Volume (cm^3)  16.5 cm^3 04/07/21 1100  Wound Surface Area (cm^2) 33 cm^2 04/07/21 1100  Tunneling (cm) 0 04/08/21 1458  Undermining (cm) 0 04/08/21 1458  Margins Unattached edges (unapproximated) 04/10/21 1236  Closure None 04/10/21 1236  Drainage Amount Copious 04/10/21 1236  Drainage Description Serosanguineous 04/10/21 1236  Non-staged Wound Description Full thickness 04/10/21 1236  Treatment Debridement (Selective);Hydrotherapy (Pulse lavage);Packing (Saline gauze) 04/10/21 1236   Hydrotherapy Pulsed lavage therapy - wound location: bilateral labia and buttock wounds Pulsed Lavage with Suction (psi): 4 psi Pulsed Lavage with Suction - Normal Saline Used: 2000 mL Pulsed Lavage Tip: Tip with splash shield Selective Debridement Selective Debridement - Location: Bilateral labia and buttock wounds Selective Debridement - Tools Used: Forceps, Scissors Selective Debridement - Tissue Removed: yellow, black/brown necrotic tissue/slough    Wound Assessment and Plan  Wound Therapy - Assess/Plan/Recommendations Wound Therapy - Clinical Statement: Wound bed appearance continues to improve. Assessed wounds with Cherylin Mylar. This patient will benefit from continued hydrotherapy for selective removal of unviable tissue, to decrease bioburden, and promote wound bed healing. Wound Therapy - Functional Problem List: limited mobility due to pain; limited ability to sit due to location of wounds Factors Delaying/Impairing Wound Healing: Infection - systemic/local, Immobility, Multiple medical problems Hydrotherapy Plan: Debridement, Dressing change, Patient/family education, Pulsatile lavage with suction Wound Therapy - Frequency: 6X / week Wound Therapy - Follow Up Recommendations: dressing changes by RN  Wound Therapy Goals- Improve the function of patient's integumentary system by progressing the wound(s) through the phases of wound  healing (inflammation - proliferation - remodeling) by: Wound Therapy Goals - Improve the function of patient's integumentary system by progressing the wound(s) through the phases of wound healing by: Decrease Necrotic Tissue to: <5% Decrease Necrotic Tissue - Progress: Progressing toward goal Increase Granulation Tissue to: >95% Increase Granulation Tissue - Progress: Progressing toward goal Decrease Length/Width/Depth by (cm): 0.5/0.5/0.3 Decrease Length/Width/Depth - Progress: Progressing toward goal Improve Drainage Characteristics: Min, Serous Improve Drainage Characteristics - Progress: Progressing toward goal Goals/treatment plan/discharge plan were made with and agreed upon by patient/family: Yes Time For Goal Achievement: 7 days Wound Therapy - Potential for Goals: Good  Goals will be updated until maximal potential achieved or discharge criteria met.  Discharge criteria: when goals achieved, discharge from hospital, MD decision/surgical intervention, no progress towards goals, refusal/missing three consecutive treatments without notification or medical reason.  GP     Charges PT Wound Care Charges $Wound Debridement up to 20 cm: < or equal to 20 cm $ Wound Debridement each add'l 20 sqcm: 4 $PT PLS Gun and Tip: 1 Supply $PT Hydrotherapy Visit: 1 Visit       Thelma Comp 04/10/2021, 12:49 PM  Rolinda Roan, PT, DPT Acute Rehabilitation Services Pager: [phone] Office: [phone]

## 2021-04-11 MED ORDER — CEFDINIR 300 MG PO CAPS: 300.0000 mg | ORAL_CAPSULE | Freq: Two times a day (BID) | ORAL | 0 refills | Status: AC

## 2021-04-11 MED ORDER — DOXYCYCLINE HYCLATE 100 MG PO TABS: 100.0000 mg | ORAL_TABLET | Freq: Two times a day (BID) | ORAL | 0 refills | Status: AC

## 2021-04-11 MED ORDER — HYDROMORPHONE HCL 1 MG/ML IJ SOLN
1.0000 mg | Freq: Once | INTRAMUSCULAR | Status: AC
  Administered 2021-04-11: 1 mg via INTRAVENOUS

## 2021-04-11 MED ORDER — SACCHAROMYCES BOULARDII 250 MG PO CAPS
250.0000 mg | ORAL_CAPSULE | Freq: Two times a day (BID) | ORAL | Status: DC
  Administered 2021-04-11: 250 mg via ORAL
  Filled 2021-04-11 (×2): qty 1

## 2021-04-11 MED ORDER — CHOLESTYRAMINE LIGHT 4 G PO PACK: 2.0000 g | PACK | Freq: Every day | ORAL | 0 refills | Status: AC

## 2021-04-11 MED ORDER — ZINC SULFATE 220 (50 ZN) MG PO CAPS: 220.0000 mg | ORAL_CAPSULE | Freq: Every day | ORAL | 0 refills | Status: DC

## 2021-04-11 MED ORDER — CHOLESTYRAMINE LIGHT 4 G PO PACK
4.0000 g | PACK | Freq: Every day | ORAL | Status: DC
  Administered 2021-04-11: 4 g via ORAL
  Filled 2021-04-11: qty 1

## 2021-04-11 MED ORDER — ENSURE ENLIVE PO LIQD: 237.0000 mL | Freq: Three times a day (TID) | ORAL | 0 refills | Status: DC

## 2021-04-11 MED ORDER — PROSOURCE PLUS PO LIQD: 30.0000 mL | Freq: Two times a day (BID) | ORAL | 0 refills | Status: DC

## 2021-04-11 MED ORDER — ADULT MULTIVITAMIN W/MINERALS CH: 1.0000 | ORAL_TABLET | Freq: Every day | ORAL | 0 refills | Status: DC

## 2021-04-11 MED ORDER — ASCORBIC ACID 250 MG PO TABS: 250.0000 mg | ORAL_TABLET | Freq: Two times a day (BID) | ORAL | 0 refills | Status: DC

## 2021-04-11 MED ORDER — SACCHAROMYCES BOULARDII 250 MG PO CAPS: 250.0000 mg | ORAL_CAPSULE | Freq: Two times a day (BID) | ORAL | 0 refills | Status: AC

## 2021-04-11 NOTE — Plan of Care (Signed)
  Problem: Education: Goal: Required Educational Video(s) 04/11/2021 0021 by Braulio Bosch, RN Outcome: Progressing 04/11/2021 0021 by Braulio Bosch, RN Outcome: Progressing   Problem: Clinical Measurements: Goal: Ability to maintain clinical measurements within normal limits will improve 04/11/2021 0021 by Braulio Bosch, RN Outcome: Progressing 04/11/2021 0021 by Braulio Bosch, RN Outcome: Progressing Goal: Postoperative complications will be avoided or minimized 04/11/2021 0021 by Braulio Bosch, RN Outcome: Progressing 04/11/2021 0021 by Braulio Bosch, RN Outcome: Progressing   Problem: Skin Integrity: Goal: Demonstration of wound healing without infection will improve 04/11/2021 0021 by Braulio Bosch, RN Outcome: Progressing 04/11/2021 0021 by Braulio Bosch, RN Outcome: Progressing   Problem: Education: Goal: Knowledge of General Education information will improve Description: Including pain rating scale, medication(s)/side effects and non-pharmacologic comfort measures 04/11/2021 0021 by Braulio Bosch, RN Outcome: Progressing 04/11/2021 0021 by Braulio Bosch, RN Outcome: Progressing

## 2021-04-11 NOTE — Care Management (Addendum)
Checked with Raquel Sarna at [facility], insurance authorization is pending.   [place] with [facility] has insurance authorization and can admit today. DR Posey Pronto aware. Patient aware   Raquel Sarna requesting MD to MD hand off, Dr Katherine Roan direct number [phone]  Number for nurse to call report [phone] or [phone] Rm 406.  Bedside nurse and charge nurse to arrange carelink and EMTALA

## 2021-04-11 NOTE — Progress Notes (Signed)
Correction: room 406

## 2021-04-11 NOTE — Progress Notes (Signed)
Report called to [facility] [phone]. Spoke with patients nurse Alex,RN for her room 604.

## 2021-04-11 NOTE — Progress Notes (Addendum)
Physical Therapy Wound Treatment Patient Details  Name: Abigail Beck MRN: [id number] Date of Birth: [birth year]  Today's Date: 04/11/2021 Time: 6962-9528 Time Calculation (min): 74 min  Subjective  Subjective Assessment Subjective: Pleasant and agreeable to hydrotherapy. Increased pain this session and RN notified pt requesting additional pain meds. Patient and Family Stated Goals: to heal wounds Date of Onset: 04/01/21 (I&D done) Prior Treatments: I&D; saline dressings  Pain Score:  Pt received pain meds pre and post therapy.  Pt did not rate pain but was tearful and with tremors during dressing removal and pulse lavage.  Turned suction down for pulse lavage, no sharp debridement, soaked dressings prior to removal, and added non-adherent dressing to address pain.   Wound Assessment  Wound / Incision (Open or Dehisced) 04/01/21 Other (Comment) Left Buttocks (Active)  Dressing Type ABD;Barrier Film (skin prep);Non adherent (over granulated area) ;Saline gauze;Santyl; abd pad 04/11/21 1500  Dressing Changed Changed 04/11/21 1500  Dressing Status Intact;Dry;Clean 04/11/21 1500  Dressing Change Frequency Daily 04/11/21 1500  Site / Wound Assessment Yellow;Pink 04/11/21 1500  % Wound base Red or Granulating 90% 04/11/21 1500  % Wound base Yellow/Fibrinous Exudate 10% 04/11/21 1500  % Wound base Black/Eschar 0% 04/11/21 1500  % Wound base Other/Granulation Tissue (Comment) 0% 04/11/21 1500  Peri-wound Assessment Intact 04/11/21 1500  Wound Length (cm) 13.6 cm 04/07/21 1000  Wound Width (cm) 5.8 cm 04/07/21 1000  Wound Depth (cm) 1.6 cm 04/07/21 1000  Wound Volume (cm^3) 126.21 cm^3 04/07/21 1000  Wound Surface Area (cm^2) 78.88 cm^2 04/07/21 1000  Tunneling (cm) 0 04/08/21 1458  Undermining (cm) 0 04/08/21 1458  Margins Unattached edges (unapproximated) 04/11/21 1500  Closure None 04/11/21 1500  Drainage Amount Copious 04/11/21 1500  Drainage Description Serosanguineous  04/11/21 1500  Non-staged Wound Description Full thickness 04/11/21 1500  Treatment Hydrotherapy (Pulse lavage);Packing (Saline gauze) 04/11/21 1500     Wound / Incision (Open or Dehisced) 04/01/21 Incision - Open Labia Right anterior to posterior/buttock (Active)  Dressing Type ABD;Barrier Film (skin prep);Non adherent over granulated area; saline gauze; Santyl; abd pad 04/11/21 1500  Dressing Changed Changed 04/11/21 1500  Dressing Status Intact;Dry;Clean 04/11/21 1500  Dressing Change Frequency Daily 04/11/21 1500  Site / Wound Assessment Red;Yellow 04/10/21 1236  % Wound base Red or Granulating 90% 04/11/21 1500  % Wound base Yellow/Fibrinous Exudate 10% 04/11/21 1500  % Wound base Black/Eschar 0% 04/11/21 1500  % Wound base Other/Granulation Tissue (Comment) 0% 04/11/21 1500  Peri-wound Assessment Intact 04/11/21 1500  Wound Length (cm) 30 cm 04/07/21 1024  Wound Width (cm) 6 cm 04/07/21 1024  Wound Depth (cm) 2 cm 04/07/21 1024  Wound Volume (cm^3) 360 cm^3 04/07/21 1024  Wound Surface Area (cm^2) 180 cm^2 04/07/21 1024  Tunneling (cm) 0 04/08/21 1458  Undermining (cm) 0 04/08/21 1458  Margins Unattached edges (unapproximated) 04/11/21 1500  Closure None 04/11/21 1500  Drainage Amount Copious 04/11/21 1500  Drainage Description Serosanguineous 04/11/21 1500  Non-staged Wound Description Full thickness 04/11/21 1500  Treatment Hydrotherapy (Pulse lavage);Packing (Saline gauze) 04/11/21 1500     Wound / Incision (Open or Dehisced) 04/01/21 Incision - Open Buttocks Right (Active)  Dressing Type ABD;Barrier Film (skin prep);Non adherent over granulated area; saline gauze;Santyl; abd pad 04/11/21 1500  Dressing Changed Changed 04/11/21 1500  Dressing Status Clean;Dry;Intact 04/11/21 1500  Dressing Change Frequency Daily 04/11/21 1500  Site / Wound Assessment Red;Yellow 04/11/21 1500  % Wound base Red or Granulating 70% 04/11/21 1500  % Wound base Yellow/Fibrinous Exudate 30%  04/11/21 1500  % Wound base Black/Eschar 0% 04/11/21 1500  % Wound base Other/Granulation Tissue (Comment) 0% 04/11/21 1500  Peri-wound Assessment Intact 04/11/21 1500  Wound Length (cm) 6 cm 04/07/21 1100  Wound Width (cm) 5.5 cm 04/07/21 1100  Wound Depth (cm) 0.5 cm 04/07/21 1100  Wound Volume (cm^3) 16.5 cm^3 04/07/21 1100  Wound Surface Area (cm^2) 33 cm^2 04/07/21 1100  Tunneling (cm) 0 04/08/21 1458  Undermining (cm) 0 04/08/21 1458  Margins Unattached edges (unapproximated) 04/11/21 1500  Closure None 04/11/21 1500  Drainage Amount Copious 04/11/21 1500  Drainage Description Serosanguineous 04/11/21 1500  Non-staged Wound Description Full thickness 04/11/21 1500  Treatment Hydrotherapy (Pulse lavage);Packing (Saline gauze) 04/11/21 1500   Hydrotherapy Pulsed lavage therapy - wound location: bilateral labia and buttock wounds Pulsed Lavage with Suction (psi): 4 psi Pulsed Lavage with Suction - Normal Saline Used: 2000 mL Pulsed Lavage Tip: Tip with splash shield    Wound Assessment and Plan  Wound Therapy - Assess/Plan/Recommendations Wound Therapy - Clinical Statement: Wound bed with good improvement and increasing granulation tissue.  Prior kling dressing adhearing to labia and mid buttock wounds that was very painful to remove requiring increased time and soaking with saline. Not as painful or adheared over R buttock and edge of posterior labia wound were necrosis still present.  Added non-adhearant dressings over granulated area for decreased pain and decreased adhearance to healthy tissue. This patient will benefit from continued hydrotherapy for selective removal of unviable tissue, to decrease bioburden, and promote wound bed healing ; however, may soon be able to discontinue hydrotherapy as granualting tissue is increasing. Wound Therapy - Functional Problem List: limited mobility due to pain; limited ability to sit due to location of wounds Factors Delaying/Impairing  Wound Healing: Infection - systemic/local, Immobility, Multiple medical problems Hydrotherapy Plan: Debridement, Dressing change, Patient/family education, Pulsatile lavage with suction Wound Therapy - Frequency: 6X / week Wound Therapy - Follow Up Recommendations: dressing changes by RN  Wound Therapy Goals- Improve the function of patient's integumentary system by progressing the wound(s) through the phases of wound healing (inflammation - proliferation - remodeling) by: Wound Therapy Goals - Improve the function of patient's integumentary system by progressing the wound(s) through the phases of wound healing by: Decrease Necrotic Tissue to: <5% Decrease Necrotic Tissue - Progress: Progressing toward goal Increase Granulation Tissue to: >95% Increase Granulation Tissue - Progress: Progressing toward goal Decrease Length/Width/Depth by (cm): 0.5/0.5/0.3 Decrease Length/Width/Depth - Progress: Progressing toward goal Improve Drainage Characteristics: Min, Serous Improve Drainage Characteristics - Progress: Progressing toward goal Goals/treatment plan/discharge plan were made with and agreed upon by patient/family: Yes Time For Goal Achievement: 7 days Wound Therapy - Potential for Goals: Good  Goals will be updated until maximal potential achieved or discharge criteria met.  Discharge criteria: when goals achieved, discharge from hospital, MD decision/surgical intervention, no progress towards goals, refusal/missing three consecutive treatments without notification or medical reason.  GP     Charges PT Wound Care Charges $Wound Debridement up to 20 cm: < or equal to 20 cm $ Wound Debridement each add'l 20 sqcm: 1 $PT Hydrotherapy Dressing: 3 dressings $PT PLS Gun and Tip: 1 Supply $PT Hydrotherapy Visit: 1 Visit     Abran Richard, PT Acute Rehab Services Pager [phone] [facility] 04/11/2021, 4:00 PM

## 2021-04-11 NOTE — Discharge Summary (Signed)
[facility] Discharge Summary   Patient: Abigail Beck [id number]  PCP: Beverley Fiedler, FNP  Date of admission: 04/01/2021   Date of discharge:  04/11/2021     Discharge Diagnoses:  Principal Problem:   Fournier's gangrene Active Problems:   Chronic hepatitis C ([place])   Protein-calorie malnutrition, severe ([place])   Crohn's disease of both small and large intestine with complication ([place])   Admitted From: home Disposition:   LTACH  Recommendations for Outpatient Follow-up:  PCP: follow up as recommended  OR findings of rectal involvement with inflammation and irregularity - will need an outpatient colonoscopy santyl to left thigh wound for eschar monitor scalp abscesses follow up with ENT outpatient for mouth lesions - they are significantly improved continue foley catheter, continue flexiseal due to wound location and risk for further contamination   Follow-up Information     Clovis Riley, MD Follow up on 05/02/2021.   Specialty: General Surgery Why: Please follow up on 05/02/21 at 9:45 am to complete the check in process for a 10:20 am appointment. Please bring insurance card and photo ID Contact information: [address] [place] [address] [phone]         Jerrell Belfast, MD. Call.   Specialty: Otolaryngology Why: call to make appointment for follow up in 2-3 weeks from discharge Contact information: [address] [place] [address] [phone]         Beverley Fiedler, [place]. Schedule an appointment as soon as possible for a visit in 1 week(s).   Specialty: Endocrinology Contact information: [place] [address] [phone]         GI at [facility]. Schedule an appointment as soon as possible for a visit in 1 month(s).                 Discharge Instructions     Diet - low sodium heart healthy   Complete by: As directed    Discharge  wound care:   Complete by: As directed    Twice daily dressing change with NS and kerlix wet to dry dressing of perineal wound Twice daily dressing change with NS and cut 4x4 wet to dry dressing left axilla Twice daily dressing change with moistened gauze covered with dry 4x4 gauze to wounds on bilateral thighs   Increase activity slowly   Complete by: As directed        Diet recommendation: Regular diet  Activity: The patient is advised to gradually reintroduce usual activities, as tolerated  Discharge Condition: stable  Code Status: Full code   History of present illness: As per the H and P dictated on admission, "Abigail Beck is a 60 y.o. female with medical history significant of Crohn's disease not on medications found to have what she reports as a vaginal abscess that ruptured on its own over the past few days.  She has noted increasing number of abscesses including on her scalp and her axilla as well as her bottom over the last few weeks.  She been placed on doxycycline and continues to take this but has noticed increased odor and purulent drainage.  Additionally she reports rectal bleeding which she notes is normal for her with mucus and blood in her stool though she does not state if she has a Crohn's flare at the time.  She had back states she has not been taking any medications for her Crohn's and that has not been acting up. (ED  Course: In the ED the patient underwent CT scanning which showed foreign years gangrene General surgery was consulted and she was taken immediately to the OR.  Her WBC was noted to be 21.4 and she had renal insufficiency at 1.43."  Hospital Course:  Summary of her active problems in the hospital is as following.  Fournier's gangrene Sepsis [place] -Underwent extensive incision and debridement of necrotizing soft tissue of the perineum and perianal field and genitalia by Dr. Windle Guard 8/1 -Continue wound care -Has a rectal tube and Foley catheter  currently to prevent contamination, anticipate she may need this for another week or 2 -has been on broad-spectrum antibiotics-Zyvox and Zosyn day 9,   no OR cultures -Clinically improving, afebrile, leukocytosis has resolved, will discontinue IV antibiotics and transition to oral doxycycline with Omnicef.  Per ID does not require IV antibiotics as already clinically improving. -Per [facility], started hydrotherapy 8/7 -Dispo to LTAC.   Left axillary wound Multiple furuncular skin lesions -Appears like disseminated staph infection, blood cultures are negative -Continue wound care to axillary wound, wound bed is clean now -Changing Zyvox to doxycycline   Large mucosal ulcer, left buccal mucosa -Etiology is unclear, has a smaller ulcer on the palate as well -Suspect this could be secondary to Crohn's disease -Appreciate ENT input -recently started seeing a GI MD again-she is due to have a colonoscopy in the near future, lost to FU in the past   Acute on chronic anemia - worsening in the setting of chronic blood loss from wounds -Anemia panel suggestive of chronic disease, given ongoing oozing from large wound, given IV iron -Continue to trend PRN   Acute kidney injury -Secondary to sepsis, improving -Off IV fluids   History of chronic hep C/cirrhosis -Imaging consistent with cirrhosis, follow-up with gastroenterology -Per chart review, known history of liver fibrosis was supposed to have a liver biopsy after hep C treatment which apparently did not happen   Moderate protein calorie malnutrition/hypoalbuminemia -Cirrhosis also contributing, continue protein supplements   Hyperglycemia -hemoglobin A1c is 5.6   History of Crohn's disease -Not on active treatment at this time -Suspect oral ulcers could be related to Crohn's disease, she started seeing a new gastroenterologist Dr. Eber Jones at [place], due to have a colonoscopy in the near future  Body mass index is 27.47 kg/m.   Nutrition Problem: Increased nutrient needs Etiology: wound healing Nutrition Interventions: Interventions: Ensure Enlive (each supplement provides 350kcal and 20 grams of protein), Prostat, Refer to RD note for recommendations, MVI  Pain control  - [place] Controlled Substance Reporting System database was reviewed. - 5 day supply was provided. - Patient was instructed, not to drive, operate heavy machinery, perform activities at heights, swimming or participation in water activities or provide baby sitting services while on Pain, Sleep and Anxiety Medications; until her outpatient Physician has advised to do so again.  - Also recommended to not to take more than prescribed Pain, Sleep and Anxiety Medications.  Patient was seen by physical therapy, who recommended LTACH. On the day of the discharge the patient's vitals were stable, and no other new acute medical condition were reported. The patient was felt safe to be discharge at [facility] with Therapy.  Consultants: General surgery  ENT Procedures: Incision and debridement of necrotizing soft tissue infection of the perineum, perianal field and genitalia 875cm; examination under anesthesia  DISCHARGE MEDICATION: Allergies as of 04/11/2021       Reactions   Lactose Intolerance (gi) Diarrhea, Nausea Only  Lisinopril Cough   Penicillins Nausea And Vomiting   Tolerating Zosyn 04/02/2021        Medication List     STOP taking these medications    doxycycline 100 MG tablet Commonly known as: ADOXA       TAKE these medications    acetaminophen 500 MG tablet Commonly known as: TYLENOL Take 2 tablets (1,000 mg total) by mouth every 6 (six) hours for 5 days.   ascorbic acid 250 MG tablet Commonly known as: VITAMIN C Take 1 tablet (250 mg total) by mouth 2 (two) times daily.   cefdinir 300 MG capsule Commonly known as: OMNICEF Take 1 capsule (300 mg total) by mouth 2 (two) times daily for 4 days.   cholestyramine  light 4 g packet Commonly known as: PREVALITE Take 0.5 packets (2 g total) by mouth daily at 12 noon for 6 days.   collagenase ointment Commonly known as: SANTYL Apply topically 2 (two) times daily for 14 days. To thigh wounds   doxycycline 100 MG tablet Commonly known as: VIBRA-TABS Take 1 tablet (100 mg total) by mouth 2 (two) times daily for 4 days.   feeding supplement Liqd Take 237 mLs by mouth 3 (three) times daily between meals.   (feeding supplement) PROSource Plus liquid Take 30 mLs by mouth 2 (two) times daily between meals.   ibuprofen 200 MG tablet Commonly known as: ADVIL Take 4 tablets (800 mg total) by mouth every 8 (eight) hours as needed for up to 5 days for moderate pain or mild pain. What changed:  how much to take when to take this reasons to take this   methocarbamol 500 MG tablet Commonly known as: ROBAXIN Take 1 tablet (500 mg total) by mouth every 6 (six) hours as needed for up to 5 days for muscle spasms (pain).   multivitamin with minerals Tabs tablet Take 1 tablet by mouth daily. Start taking on: April 12, 2021   Oxycodone HCl 10 MG Tabs Take 1 tablet (10 mg total) by mouth every 4 (four) hours as needed for up to 7 days for severe pain or breakthrough pain.   saccharomyces boulardii 250 MG capsule Commonly known as: FLORASTOR Take 1 capsule (250 mg total) by mouth 2 (two) times daily for 10 days.   zinc sulfate 220 (50 Zn) MG capsule Take 1 capsule (220 mg total) by mouth daily. Start taking on: April 12, 2021               Discharge Care Instructions  (From admission, onward)           Start     Ordered   04/11/21 0000  Discharge wound care:       Comments: Twice daily dressing change with NS and kerlix wet to dry dressing of perineal wound Twice daily dressing change with NS and cut 4x4 wet to dry dressing left axilla Twice daily dressing change with moistened gauze covered with dry 4x4 gauze to wounds on bilateral thighs    04/11/21 1315            Discharge Exam: Filed Weights   04/01/21 1700  Weight: 72.6 kg   Vitals:   04/11/21 0635 04/11/21 0931  BP: 117/72 113/63  Pulse: 97 (!) 110  Resp: 16 17  Temp: 97.7 F (36.5 C) 98.5 F (36.9 C)  SpO2: 99% 100%   General: Appear in no distress, no Rash; diffuse pustules with brownish discharge, Oral Mucosa Clear, moist. no Abnormal Neck Mass  Or lumps, Conjunctiva normal  Cardiovascular: S1 and S2 Present, no Murmur Respiratory: good respiratory effort, Bilateral Air entry present and CTA, no Crackles, no wheezes Abdomen: Bowel Sound present, Soft and no tenderness Extremities: no Pedal edema Neurology: alert and oriented to time, place, and person affect appropriate. no new focal deficit  The results of significant diagnostics from this hospitalization (including imaging, microbiology, ancillary and laboratory) are listed below for reference.    Significant Diagnostic Studies: CT ABDOMEN PELVIS W CONTRAST  Result Date: 04/01/2021 CLINICAL DATA:  Anemia, bleeding hemorrhoids, foul-smelling vaginal odor EXAM: CT ABDOMEN AND PELVIS WITH CONTRAST TECHNIQUE: Multidetector CT imaging of the abdomen and pelvis was performed using the standard protocol following bolus administration of intravenous contrast. CONTRAST:  42m OMNIPAQUE IOHEXOL 300 MG/ML  SOLN COMPARISON:  04/15/2015, 10/20/2020 FINDINGS: Lower chest: No acute pleural or parenchymal lung disease. Hepatobiliary: Stable heterogeneity and capsular retraction along the superior aspect right lobe liver, likely related to patient's known history of cirrhosis. No change in appearance since prior MRI. No intrahepatic duct dilation. Continued distention of the gallbladder without evidence of cholelithiasis or cholecystitis. Pancreas: Unremarkable. No pancreatic ductal dilatation or surrounding inflammatory changes. Pancreatic divisum again incidentally noted. Spleen: Normal in size without focal abnormality.  Adrenals/Urinary Tract: Stable right renal cyst. Otherwise the kidneys enhance normally and symmetrically. No urinary tract calculi or obstructive uropathy. The adrenals and bladder are grossly unremarkable. Stomach/Bowel: No bowel obstruction or ileus. Mild mural thickening of the cecum and proximal ascending colon may reflect inflammatory or infectious colitis. Normal appendix right lower quadrant. Vascular/Lymphatic: The borderline enlarged reactive lymph nodes seen within the upper abdomen on prior MRI and CT are not appreciably changed. No pathologic adenopathy. No significant vascular findings. Reproductive: Heterogeneous uterus consistent with multiple fibroids unchanged. There are no adnexal masses. There is subcutaneous gas within the bilateral labia, with subcu gas and inflammatory changes extending to the perineum. Findings are consistent with Fournier gangrene. No localized fluid collection or abscess. Other: No free intraperitoneal fluid or free gas. No abdominal wall hernia. Musculoskeletal: No acute or destructive bony lesions. Reconstructed images demonstrate no additional findings. IMPRESSION: 1. 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 2. Mild wall thickening of the proximal colon, consistent with inflammatory bowel disease in a patient with a history of Crohn disease. 3. Stable heterogeneity and capsular retraction of the right lobe liver, unchanged since MRI and most consistent with history of cirrhosis. 4. Fibroid uterus. Critical Value/emergent results were called by telephone at the time of interpretation on 04/01/2021 at 5:28 pm to provider [facility], who verbally acknowledged these results. Electronically Signed   By: MRanda NgoM.D.   On: 04/01/2021 17:42    Microbiology: Recent Results (from the past 240 hour(s))  Resp Panel by RT-PCR (Flu A&B, Covid) Nasopharyngeal Swab      Status: None   Collection Time: 04/01/21  5:32 PM   Specimen: Nasopharyngeal Swab; Nasopharyngeal(NP) swabs in vial transport medium  Result Value Ref Range Status   SARS Coronavirus 2 by RT PCR NEGATIVE NEGATIVE Final    Comment: (NOTE) SARS-CoV-2 target nucleic acids are NOT DETECTED.  The SARS-CoV-2 RNA is generally detectable in upper respiratory specimens during the acute phase of infection. The lowest concentration of SARS-CoV-2 viral copies this assay can detect is 138 copies/mL. A negative result does not preclude SARS-Cov-2 infection and should not be used as the sole basis for treatment or other patient  management decisions. A negative result may occur with  improper specimen collection/handling, submission of specimen other than nasopharyngeal swab, presence of viral mutation(s) within the areas targeted by this assay, and inadequate number of viral copies(<138 copies/mL). A negative result must be combined with clinical observations, patient history, and epidemiological information. The expected result is Negative.  Fact Sheet for Patients:  EntrepreneurPulse.com.au  Fact Sheet for Healthcare Providers:  IncredibleEmployment.be  This test is no t yet approved or cleared by the [place] FDA and  has been authorized for detection and/or diagnosis of SARS-CoV-2 by FDA under an Emergency Use Authorization (EUA). This EUA will remain  in effect (meaning this test can be used) for the duration of the COVID-19 declaration under Section 564(b)(1) of the Act, 21 U.S.C.section 360bbb-3(b)(1), unless the authorization is terminated  or revoked sooner.       Influenza A by PCR NEGATIVE NEGATIVE Final   Influenza B by PCR NEGATIVE NEGATIVE Final    Comment: (NOTE) The Xpert Xpress SARS-CoV-2/FLU/RSV plus assay is intended as an aid in the diagnosis of influenza from Nasopharyngeal swab specimens and should not be used as a sole basis  for treatment. Nasal washings and aspirates are unacceptable for Xpert Xpress SARS-CoV-2/FLU/RSV testing.  Fact Sheet for Patients: EntrepreneurPulse.com.au  Fact Sheet for Healthcare Providers: IncredibleEmployment.be  This test is not yet approved or cleared by the [place] FDA and has been authorized for detection and/or diagnosis of SARS-CoV-2 by FDA under an Emergency Use Authorization (EUA). This EUA will remain in effect (meaning this test can be used) for the duration of the COVID-19 declaration under Section 564(b)(1) of the Act, 21 U.S.C. section 360bbb-3(b)(1), unless the authorization is terminated or revoked.  Performed at [facility], [place] [address], [place], [place] [address]   Culture, blood (Routine X 2) w Reflex to ID Panel     Status: None   Collection Time: 04/03/21 10:01 AM   Specimen: BLOOD LEFT HAND  Result Value Ref Range Status   Specimen Description BLOOD LEFT HAND  Final   Special Requests   Final    BOTTLES DRAWN AEROBIC ONLY Blood Culture results may not be optimal due to an inadequate volume of blood received in culture bottles   Culture   Final    NO GROWTH 6 DAYS Performed at [facility], [place] [address] [place], [place] [address]    Report Status 04/09/2021 FINAL  Final  Culture, blood (Routine X 2) w Reflex to ID Panel     Status: None   Collection Time: 04/03/21 10:11 AM   Specimen: BLOOD RIGHT HAND  Result Value Ref Range Status   Specimen Description BLOOD RIGHT HAND  Final   Special Requests   Final    BOTTLES DRAWN AEROBIC ONLY Blood Culture results may not be optimal due to an inadequate volume of blood received in culture bottles   Culture   Final    NO GROWTH 6 DAYS Performed at [facility], [place] [address], [place], [place] [address]    Report Status 04/09/2021 FINAL  Final     Labs: CBC: Recent Labs  Lab 04/05/21 0104 04/07/21 [id number] 04/08/21 0541 04/09/21 0550  04/10/21 0619  WBC 11.7* 11.0* 9.1 7.9 7.7  HGB 10.1* 9.8* 9.3* 8.9* 8.6*  HCT 29.4* 28.8* 27.7* 26.7* 26.3*  MCV 85.5 87.5 89.4 89.6 88.9  PLT 190 226 217 201 [id number]   Basic Metabolic Panel: Recent Labs  Lab 04/05/21 0104  04/07/21 [id number] 04/08/21 0541 04/10/21 0619  NA 133* 131* 131* 130*  K 2.9* 4.1 4.8 4.2  CL 102 102 99 98  CO2 '22 24 27 25  '$ GLUCOSE 98 133* 207* 108*  BUN '10 8 9 9  '$ CREATININE 0.88 0.83 0.93 0.82  CALCIUM 7.9* 8.1* 8.1* 8.3*   Liver Function Tests: No results for input(s): AST, ALT, ALKPHOS, BILITOT, PROT, ALBUMIN in the last 168 hours. CBG: No results for input(s): GLUCAP in the last 168 hours.  Time spent: 35 minutes  Signed:  Berle Mull  [facility]  04/11/2021

## 2021-04-11 NOTE — Progress Notes (Signed)
Patient discharged to [facility] via [facility] transportation. All patient belongings taken with pts daughter. Report called already to her nurse in room [place]. Nurse made aware of foley and flexasil and perineum wounds. IV left in for transportation as requested by [facility] staff.

## 2021-08-01 DEATH — deceased

## 2023-06-14 IMAGING — CT CT ABD-PELV W/ CM
2 of 5 series · 15 of 46 positions shown, 17 images · IV contrast (APPLIED)
Comparison: 04/15/2015, 10/20/2020

CLINICAL DATA: Anemia, bleeding hemorrhoids, foul-smelling vaginal
odor

EXAM:
CT ABDOMEN AND PELVIS WITH CONTRAST
TECHNIQUE: Multidetector CT imaging of the abdomen and pelvis was performed
using the standard protocol following bolus administration of
intravenous contrast.
CONTRAST:  80mL OMNIPAQUE IOHEXOL 300 MG/ML  SOLN

[Series 3: abd/ pelvis 5.0 i30f 2 · axial · 0.87mm/px · z∈[+195,+645]mm · 12 of 102 slices shown, 14 images]
[im 6/102  soft-tissue]
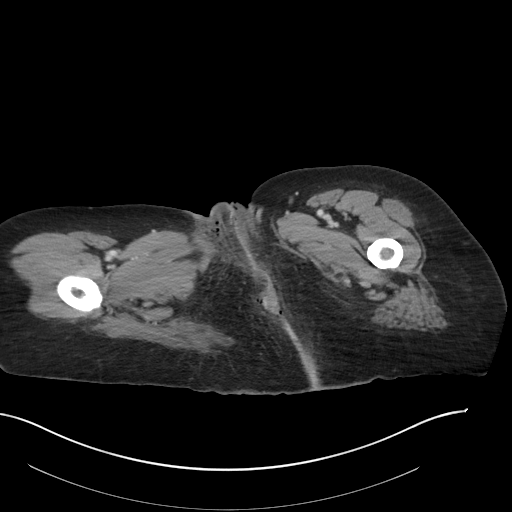
[im 6/102  bone]
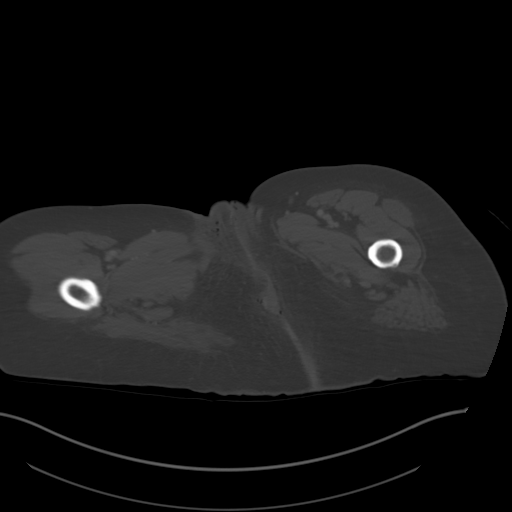
[im 16/102  soft-tissue]
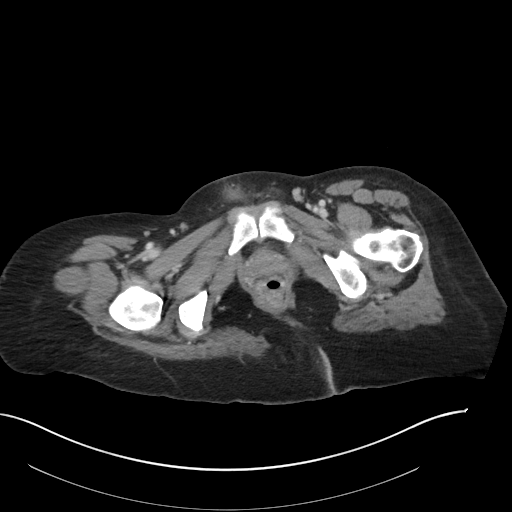
[im 22/102  soft-tissue]
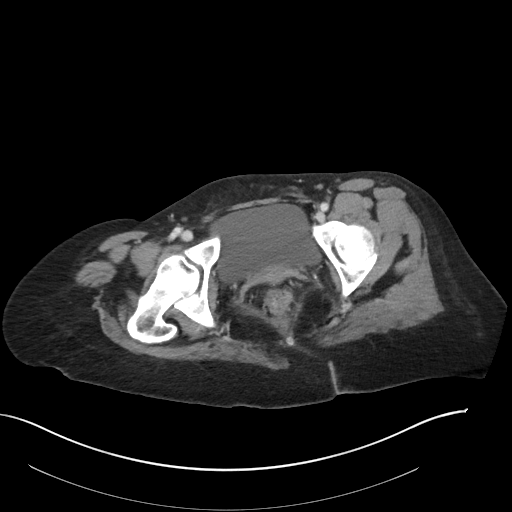
[im 32/102  soft-tissue]
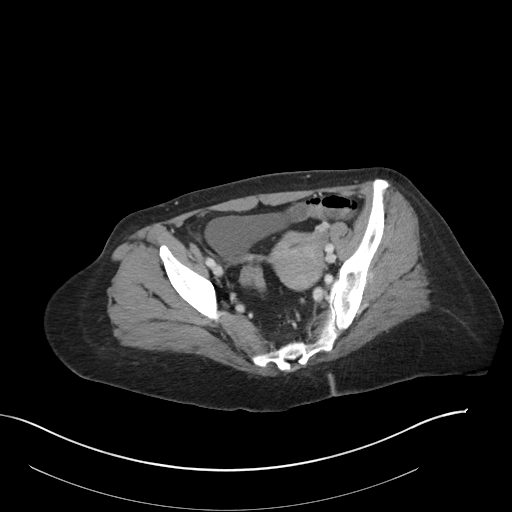
[im 38/102  soft-tissue]
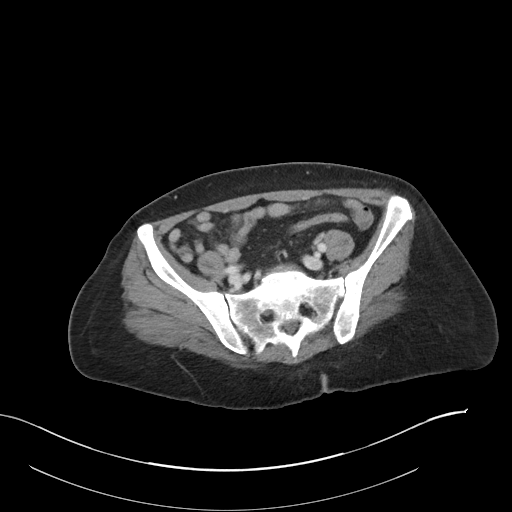
[im 48/102  soft-tissue]
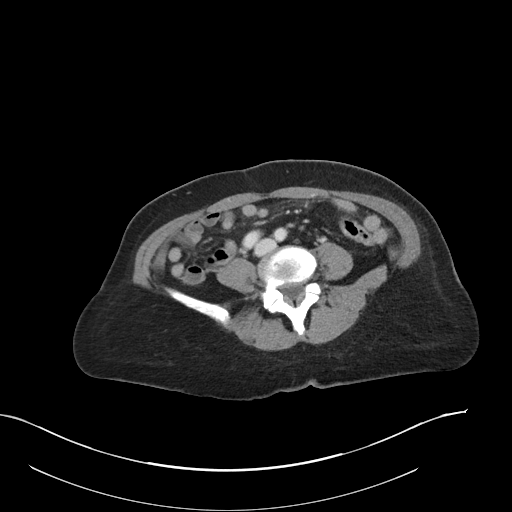
[im 54/102  soft-tissue]
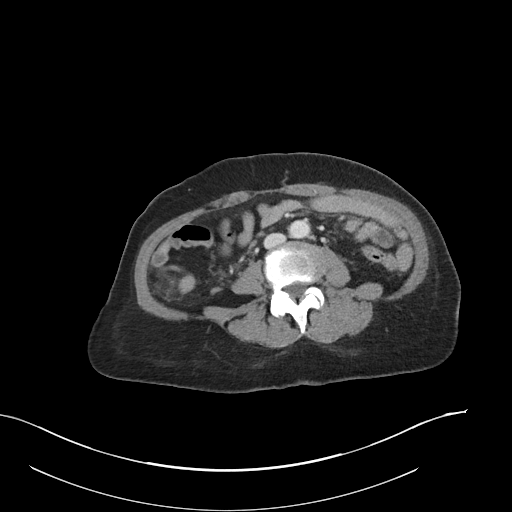
[im 64/102  soft-tissue]
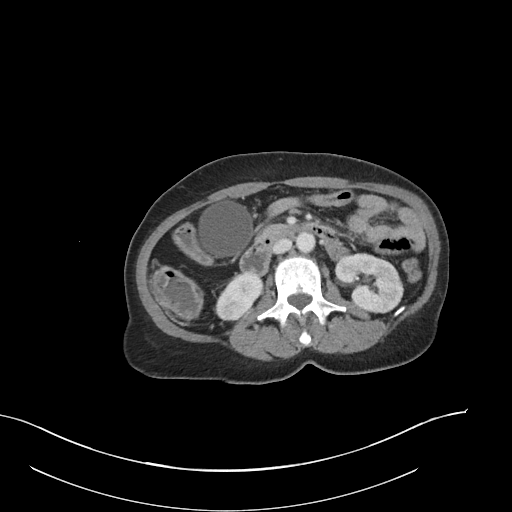
[im 70/102  soft-tissue]
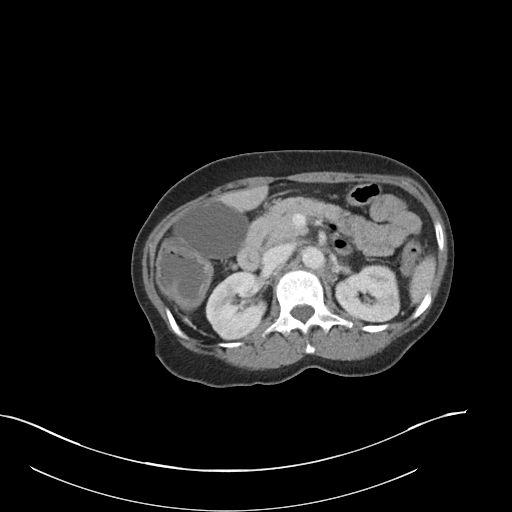
[im 70/102  bone]
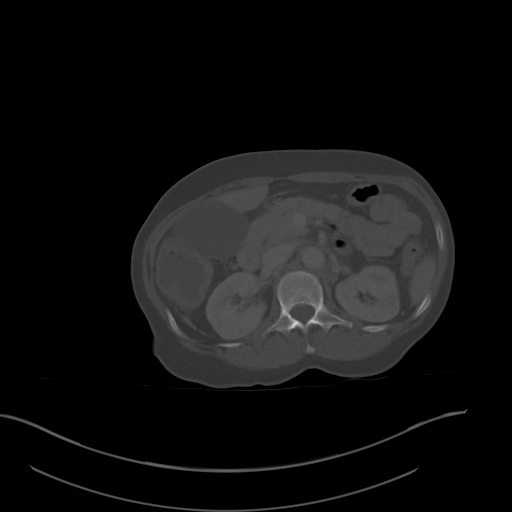
[im 80/102  soft-tissue]
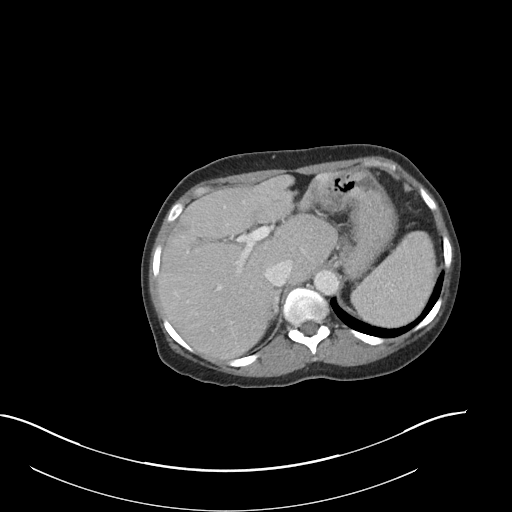
[im 86/102  soft-tissue]
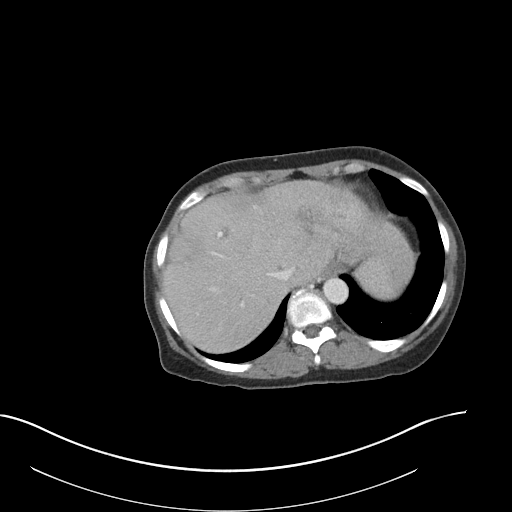
[im 96/102  soft-tissue]
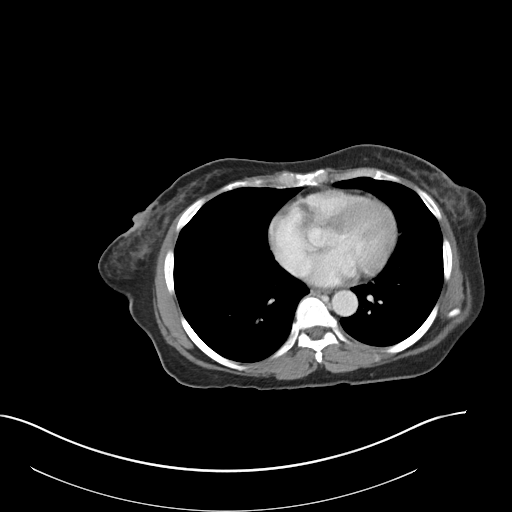

[Series 6: coronal soft tissue · coronal · 0.78mm/px · 3 of 83 slices shown]
[im 28/83  soft-tissue]
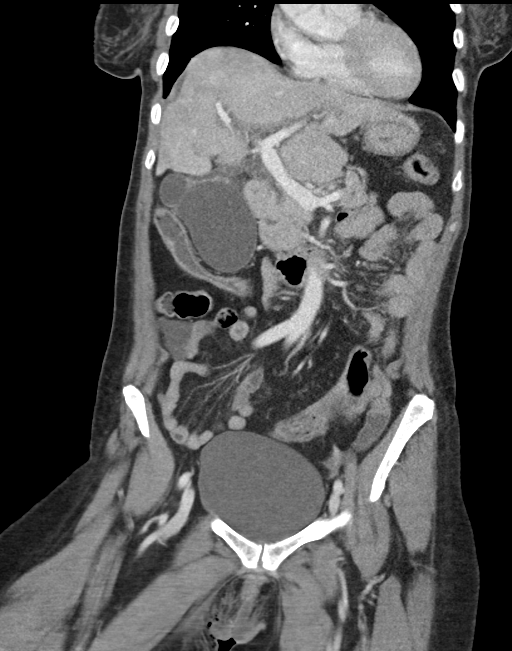
[im 37/83  soft-tissue]
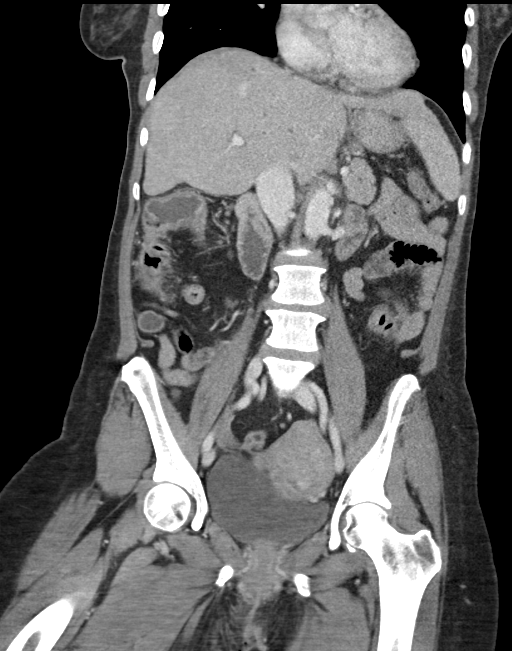
[im 46/83  soft-tissue]
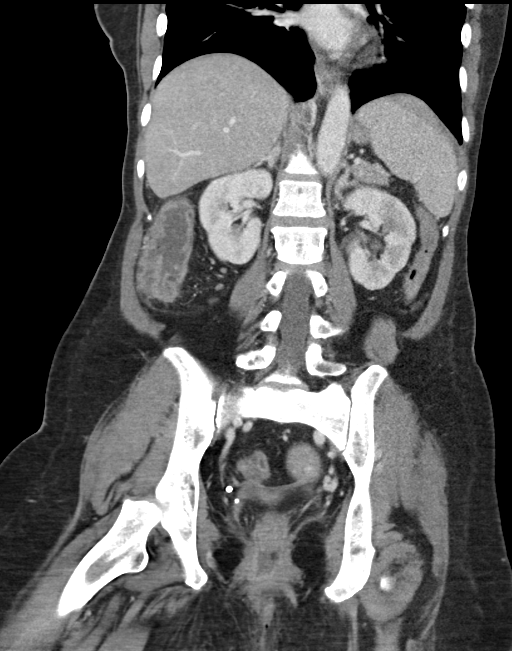

[15 of 46 positions shown; findings below may reference images not displayed]

FINDINGS: Lower chest: No acute pleural or parenchymal lung disease.

Hepatobiliary: Stable heterogeneity and capsular retraction along
the superior aspect right lobe liver, likely related to patient's
known history of cirrhosis. No change in appearance since prior MRI.
No intrahepatic duct dilation. Continued distention of the
gallbladder without evidence of cholelithiasis or cholecystitis.

Pancreas: Unremarkable. No pancreatic ductal dilatation or
surrounding inflammatory changes. Pancreatic divisum again
incidentally noted.

Spleen: Normal in size without focal abnormality.

Adrenals/Urinary Tract: Stable right renal cyst. Otherwise the
kidneys enhance normally and symmetrically. No urinary tract calculi
or obstructive uropathy. The adrenals and bladder are grossly
unremarkable.

Stomach/Bowel: No bowel obstruction or ileus. Mild mural thickening
of the cecum and proximal ascending colon may reflect inflammatory
or infectious colitis. Normal appendix right lower quadrant.

Vascular/Lymphatic: The borderline enlarged reactive lymph nodes
seen within the upper abdomen on prior MRI and CT are not
appreciably changed. No pathologic adenopathy. No significant
vascular findings.

Reproductive: Heterogeneous uterus consistent with multiple fibroids
unchanged. There are no adnexal masses.

There is subcutaneous gas within the bilateral labia, with subcu gas
and inflammatory changes extending to the perineum. Findings are
consistent with Fournier gangrene. No localized fluid collection or
abscess.

Other: No free intraperitoneal fluid or free gas. No abdominal wall
hernia.

Musculoskeletal: No acute or destructive bony lesions. Reconstructed
images demonstrate no additional findings.
IMPRESSION: 1. Fournier gangrene involving the bilateral labia and perineum,
with extensive subcutaneous inflammatory changes and subcutaneous
gas as above.
2. Mild wall thickening of the proximal colon, consistent with
inflammatory bowel disease in a patient with a history of Crohn
disease.
3. Stable heterogeneity and capsular retraction of the right lobe
liver, unchanged since MRI and most consistent with history of
cirrhosis.
4. Fibroid uterus.

Critical Value/emergent results were called by telephone at the time
of interpretation on 04/01/2021 at [DATE] to provider AURTHOHIN LASKAR ,
who verbally acknowledged these results.
